# Patient Record
Sex: Male | Born: 1980 | Race: White | Hispanic: No | Marital: Married | Smoking: Current every day smoker
Health system: Southern US, Community
[De-identification: ages and names within clinical notes are randomized; demographics above are authoritative.]

## PROBLEM LIST (undated history)

## (undated) DIAGNOSIS — H5010 Unspecified exotropia: Secondary | ICD-10-CM

## (undated) HISTORY — PX: LUMBAR DISC SURGERY: SHX700

---

## 2007-02-12 ENCOUNTER — Ambulatory Visit (HOSPITAL_COMMUNITY): Admission: RE | Admit: 2007-02-12 | Discharge: 2007-02-12 | Payer: Self-pay | Admitting: Neurosurgery

## 2008-03-29 IMAGING — CR DG LUMBAR SPINE 2-3V
1 series · 1 of 1 positions shown · non-contrast
Comparison: None.

CLINICAL DATA: Localization for laminectomy/discectomy. 
 LUMBAR SPINE ? 2 VIEW:

[view not recorded]
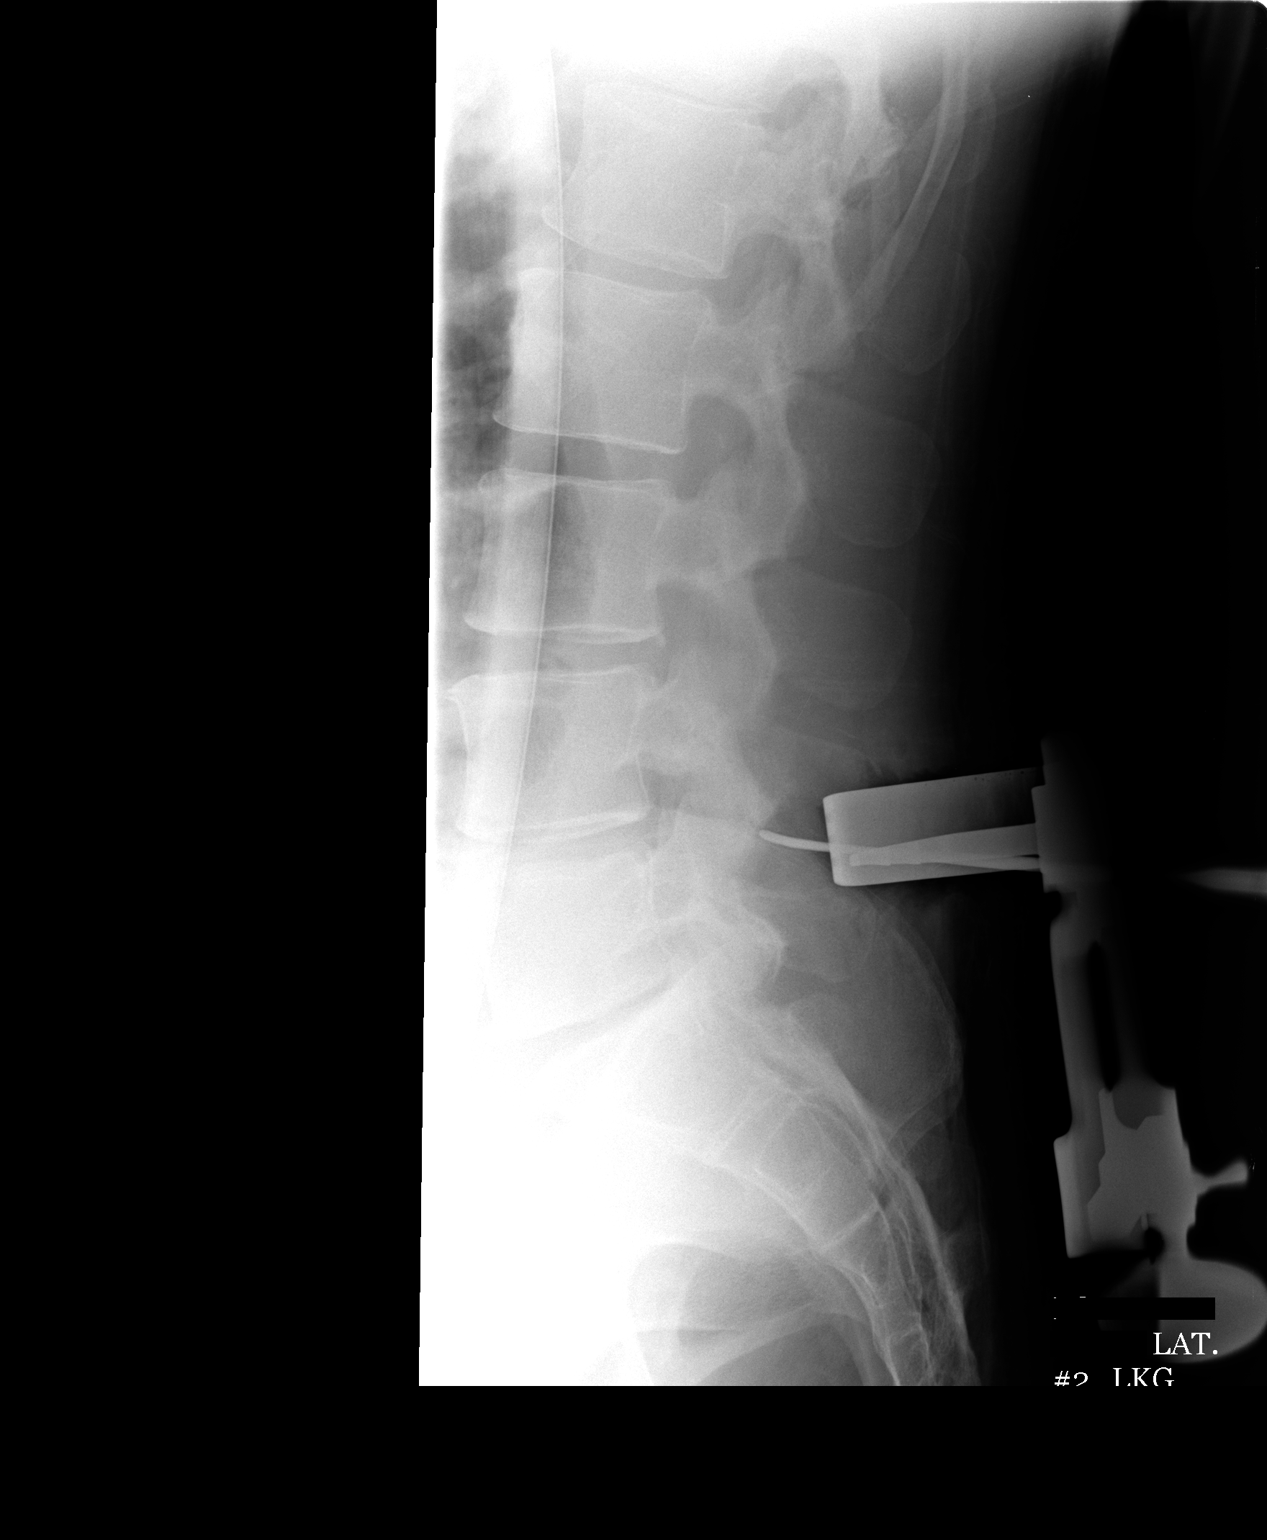

[1 of 1 positions shown; findings below may reference images not displayed]

FINDINGS: Film #1 at 6465 hours ? a needle tip projects over the posterior spinous process of L4.
IMPRESSION: L4 localized. 
 LUMBAR SPINE:
 Film #2 at 7550 hours ? there is an instrument now positioned posterior to the neural canal at L4-5.  Both the L4-5 and the L5-S1 disc space are narrowed.
IMPRESSION: Instruments posterior to L4-5.

## 2012-12-31 ENCOUNTER — Ambulatory Visit: Payer: Self-pay | Admitting: Family Medicine

## 2012-12-31 VITALS — BP 125/76 | HR 93 | Temp 98.7°F | Resp 16 | Ht 74.5 in | Wt 267.0 lb

## 2012-12-31 DIAGNOSIS — Z0289 Encounter for other administrative examinations: Secondary | ICD-10-CM

## 2012-12-31 DIAGNOSIS — Z0000 Encounter for general adult medical examination without abnormal findings: Secondary | ICD-10-CM

## 2012-12-31 NOTE — Patient Instructions (Addendum)
Continue to see your eye doctor regularly

## 2012-12-31 NOTE — Progress Notes (Signed)
[facility] [address] [place] [address] [phone]  Date:  12/31/2012   Name:  Mario Kim   DOB:  [birth year]   MRN:  [id number]  PCP:  No primary provider on file.    Chief Complaint: Employment Physical   History of Present Illness:  Mario Kim is a 32 y.o. very pleasant male patient who presents with the following:  He is here today for a DOT exam. He states he does not have HTN- he does not use any medicaiton for this.  May be in his history in error.   History of diskectomy in 2008- he takes excedrin on occasion, but overall he is doing very well.  He has normal leg strength and sensation.   He has a history of eye dominance/ lazy eye problems.  He sees an eye doctor regularly and has had this problem since childhood.  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    There is no problem list on file for this patient.   Past Medical History  Diagnosis Date  . Asthma   . Hypertension     Past Surgical History  Procedure Date  . Spine surgery     History  Substance Use Topics  . Smoking status: Current Every Day Smoker  . Smokeless tobacco: Not on file  . Alcohol Use: 0.0 oz/week    3-6 Cans of beer per week    Family History  Problem Relation Age of Onset  . Asthma Mother   . Hyperlipidemia Brother     No Known Allergies  Medication list has been reviewed and updated.  No current outpatient prescriptions on file prior to visit.    Review of Systems:  As per HPI- otherwise negative.   Physical Examination: Filed Vitals:   12/31/12 1123  BP: 125/76  Pulse: 93  Temp: 98.7 F (37.1 C)  Resp: 16   Filed Vitals:   12/31/12 1123  Height: 6' 2.5" (1.892 m)  Weight: 267 lb (121.11 kg)   Body mass index is 33.82 kg/(m^2). Ideal Body Weight: Weight in (lb) to have BMI = 25: 196.9   GEN: WDWN, NAD, Non-toxic, A & O x 3 HEENT: Atraumatic, Normocephalic. Neck supple. No masses, No LAD. Bilateral TM  wnl, oropharynx normal.  PEERL,EOMI.  Right eye will deviate externally at times, but is brought back in easily Ears and Nose: No external deformity. CV: RRR, No M/G/R. No JVD. No thrill. No extra heart sounds. PULM: CTA B, no wheezes, crackles, rhonchi. No retractions. No resp. distress. No accessory muscle use. ABD: S, NT, ND,. No rebound. No HSM. EXTR: No c/c/e NEURO Normal gait. Normal strength, sensation and DTR all extremities  PSYCH: Normally interactive. Conversant. Not depressed or anxious appearing.  Calm demeanor.  GU: no inguinal hernia  Able to read the 20/ 40 line with the right eye. See paper DOT form Assessment and Plan: 1. Physical exam, annual    Given a 2 year DOT card today.  He will see his eye doctor later this spring.    Abbe Amsterdam, MD

## 2013-01-03 ENCOUNTER — Encounter: Payer: Self-pay | Admitting: Family Medicine

## 2014-12-29 ENCOUNTER — Ambulatory Visit (INDEPENDENT_AMBULATORY_CARE_PROVIDER_SITE_OTHER): Payer: Self-pay | Admitting: Emergency Medicine

## 2014-12-29 VITALS — BP 110/64 | HR 92 | Temp 98.1°F | Resp 18 | Ht 76.0 in | Wt 294.0 lb

## 2014-12-29 DIAGNOSIS — Z0289 Encounter for other administrative examinations: Secondary | ICD-10-CM

## 2014-12-29 DIAGNOSIS — Z0000 Encounter for general adult medical examination without abnormal findings: Secondary | ICD-10-CM

## 2014-12-29 NOTE — Progress Notes (Signed)
[facility] [address], [place] [address] [phone]  Date:  12/29/2014   Name:  Mario ProwsMichael R Damaso   DOB:  05/01/1981   MRN:  [id number]  PCP:  No primary care provider on file.    Chief Complaint: Annual Exam   History of Present Illness:  Mario Kim is a 34 y.o. very pleasant male patient who presents with the following:  DOT   There are no active problems to display for this patient.   Past Medical History  Diagnosis Date  . Asthma     Past Surgical History  Procedure Laterality Date  . Spine surgery      History  Substance Use Topics  . Smoking status: Current Every Day Smoker  . Smokeless tobacco: Not on file  . Alcohol Use: 0.0 oz/week    3-6 Cans of beer per week    Family History  Problem Relation Age of Onset  . Asthma Mother   . Hyperlipidemia Brother   . Hypertension Brother     No Known Allergies  Medication list has been reviewed and updated.  No current outpatient prescriptions on file prior to visit.   No current facility-administered medications on file prior to visit.    Review of Systems:  As per HPI, otherwise negative.    Physical Examination: Filed Vitals:   12/29/14 1158  BP: 110/64  Pulse: 92  Temp: 98.1 F (36.7 C)  Resp: 18   Filed Vitals:   12/29/14 1158  Height: 6\' 4"  (1.93 m)  Weight: 294 lb (133.358 kg)   Body mass index is 35.8 kg/(m^2). Ideal Body Weight: Weight in (lb) to have BMI = 25: 205  GEN: WDWN, NAD, Non-toxic, A & O x 3 HEENT: Atraumatic, Normocephalic. Neck supple. No masses, No LAD. Ears and Nose: No external deformity. CV: RRR, No M/G/R. No JVD. No thrill. No extra heart sounds. PULM: CTA B, no wheezes, crackles, rhonchi. No retractions. No resp. distress. No accessory muscle use. ABD: S, NT, ND, +BS. No rebound. No HSM. EXTR: No c/c/e NEURO Normal gait.  PSYCH: Normally interactive. Conversant. Not depressed or anxious appearing.  Calm demeanor.     Assessment and Plan: DOT  Signed,  Phillips OdorJeffery Ajaya Crutchfield, MD

## 2015-10-13 ENCOUNTER — Ambulatory Visit: Payer: Self-pay

## 2015-10-13 ENCOUNTER — Other Ambulatory Visit: Payer: Self-pay | Admitting: Occupational Medicine

## 2015-10-13 DIAGNOSIS — M25572 Pain in left ankle and joints of left foot: Secondary | ICD-10-CM

## 2015-11-18 ENCOUNTER — Ambulatory Visit: Payer: Worker's Compensation | Attending: Occupational Medicine | Admitting: Physical Therapy

## 2015-11-18 DIAGNOSIS — R262 Difficulty in walking, not elsewhere classified: Secondary | ICD-10-CM | POA: Diagnosis present

## 2015-11-18 DIAGNOSIS — R600 Localized edema: Secondary | ICD-10-CM | POA: Insufficient documentation

## 2015-11-18 DIAGNOSIS — X58XXXA Exposure to other specified factors, initial encounter: Secondary | ICD-10-CM | POA: Insufficient documentation

## 2015-11-18 DIAGNOSIS — S93402A Sprain of unspecified ligament of left ankle, initial encounter: Secondary | ICD-10-CM | POA: Diagnosis present

## 2015-11-18 NOTE — Therapy (Signed)
[facility] [address] [place], [place], [address] Phone: [phone]   Fax:  [phone]  Physical Therapy Evaluation  Patient Details  Name: Mario Kim MRN: [id number] Date of Birth: 02-Dec-1981 Referring Provider: Alto Denver  Encounter Date: 11/18/2015      PT End of Session - 11/18/15 1838    Visit Number 1   Number of Visits 6   Date for PT Re-Evaluation 12/30/15   PT Start Time 0845   PT Stop Time 0935   PT Time Calculation (min) 50 min   Activity Tolerance Patient tolerated treatment well   Behavior During Therapy [facility] for tasks assessed/performed      Past Medical History  Diagnosis Date  . Asthma     Past Surgical History  Procedure Laterality Date  . Spine surgery      There were no vitals filed for this visit.  Visit Diagnosis:  Left ankle sprain, initial encounter  Walking difficulty due to ankle and foot  Localized edema      Subjective Assessment - 11/18/15 0851    Subjective Pt slipped off truck and injured L ankle.  He wore a Cam walker for a few weeks and transitioned to ASO which he still wears.  He cont to have pain and min swelling.  He feels weaker in L ankle, lacks confidence with work duties. Normal job requires climbing trucks, ladder, Pharmacist, community.  He is on light duty, just driving and moving lighter equipment , office work.      Pertinent History 2008 discectomy.    Limitations Lifting;Standing;Walking;House hold activities;Other (comment)  Stairs   How long can you sit comfortably? sometimes pain incr after 30 min     How long can you stand comfortably? up and moving 20-30 min begins to be sore (5/10).     How long can you walk comfortably? 20-30 min    Diagnostic tests XR   Patient Stated Goals get full function and strength in it.    Currently in Pain? Yes   Pain Score 3    Pain Location Ankle  moves around   Pain Orientation Left   Pain Descriptors / Indicators Sore   Pain Type Chronic pain;Acute pain   Pain Onset More than a month ago   Pain Frequency Intermittent   Aggravating Factors  increased effort with walking or pushing    Pain Relieving Factors ASO brace, Ibuprofen, rest   Effect of Pain on Daily Activities work,             [facility] PT Assessment - 11/18/15 0858    Assessment   Medical Diagnosis L ankle sprain    Referring Provider Hunt   Prior Therapy No   Precautions   Precautions None   Required Braces or Orthoses Other Brace/Splint   Restrictions   Weight Bearing Restrictions No   Balance Screen   Has the patient fallen in the past 6 months Yes   How many times? 2   Has the patient had a decrease in activity level because of a fear of falling?  No   Is the patient reluctant to leave their home because of a fear of falling?  No   Home Nurse, mental health Private residence   Living Arrangements Spouse/significant other;Children   Type of Home House   Home Access Level entry   Entrance Stairs-Number of Steps 20   Entrance Stairs-Rails Can reach both   Home Layout Two level   Prior  Function   Level of Independence Independent   Cognition   Overall Cognitive Status Within Functional Limits for tasks assessed   Observation/Other Assessments-Edema    Edema Circumferential   Circumferential Edema   Circumferential - Right 23.5 inch   Circumferential - Left  24 inch   Sensation   Light Touch Appears Intact   Coordination   Gross Motor Movements are Fluid and Coordinated Not tested   Posture/Postural Control   Posture/Postural Control No significant limitations   AROM   Left Ankle Dorsiflexion -7   Left Ankle Plantar Flexion 47   Left Ankle Inversion 18   Left Ankle Eversion 29   Strength   Right Knee Flexion 5/5   Right Knee Extension 5/5   Left Knee Flexion 4+/5   Left Knee Extension 5/5   Left Ankle Dorsiflexion 4/5   Left Ankle Plantar Flexion 3+/5   Left Ankle Inversion 4/5   Left Ankle Eversion  3+/5   Palpation   Palpation comment min soreness, puffy lateral malleolus and into the top of foot            PT Education - 11/18/15 1838    Education provided Yes   Education Details PT/POC, ice post HEP and ankle HEP for Theraband, calf stretch   Person(s) Educated Patient   Methods Explanation;Demonstration;Verbal cues;Handout;Tactile cues   Comprehension Verbalized understanding;Returned demonstration             PT Long Term Goals - 11/18/15 1854    PT LONG TERM GOAL #1   Title Pt will be able to wean off ASO when not at work as MD allows.    Time 4   Period Weeks   Status New   PT LONG TERM GOAL #2   Title Pt will be able to stand and walk for up to an hour with only min increase in pain   Time 4   Period Weeks   Status New   PT LONG TERM GOAL #3   Title Pt will be able to demo strength in L ankle to 4+/5 or more in all planes to improve gait stability, work duties.    Time 4   Period Weeks   Status New   PT LONG TERM GOAL #4   Title Pt will demo full AROM in L ankle without increased pain.    Time 4   Period Weeks   Status New               Plan - 11/18/15 1839    Clinical Impression Statement Patient with pain, swelling and decreased ROM following L ankle sprain and subsequent immobility of ankle joint.   He was approved 6 visits and hopes to regain full function to allow return to full work load.    Pt will benefit from skilled therapeutic intervention in order to improve on the following deficits Decreased range of motion;Difficulty walking;Increased fascial restricitons;Pain;Impaired flexibility;Decreased balance;Decreased mobility;Decreased strength;Increased edema   Rehab Potential Excellent   PT Frequency 2x / week   PT Duration 3 weeks   PT Treatment/Interventions Ultrasound;Passive range of motion;Patient/family education;Gait training;Balance training;Therapeutic exercise;Manual techniques;Vasopneumatic Device;Therapeutic  activities;Electrical Stimulation;Functional mobility training;Taping;Stair training;Cryotherapy;Neuromuscular re-education   PT Next Visit Plan review HEP, US, assess single leg balance, foot instrinsics, vaso, also check Rt. ankle AROM    PT Home Exercise Plan T band green x 4 way and calf stretch   Consulted and Agree with Plan of Care Patient  Problem List There are no active problems to display for this patient.   Janila Arrazola 11/18/2015, 7:03 PM  [facility] [address] [place], [place], [address] Phone: [phone]   Fax:  [phone]  Name: Mario Kim MRN: [id number] Date of Birth: Dec 21, 1980  Karie Mainland, PT 11/18/2015 7:04 PM Phone: [phone] Fax: [phone]

## 2015-11-18 NOTE — Patient Instructions (Signed)
Gastroc / Heel Cord Stretch - Seated With Towel   Sit on floor, towel around ball of foot. Gently pull foot in toward body, stretching heel cord and calf. Hold for __30_ seconds. Repeat on involved leg. Repeat __5_ times. Do __2_ times per day. Ankle Circles    Slowly rotate right foot and ankle clockwise then counterclockwise. Gradually increase range of motion. Avoid pain. Circle __10-20__ times each direction per set. Do __2__ sets per session. Do ___2_ sessions per day.  http://orth.exer.us/30   Copyright  VHI. All rights reserved.     Inversion: Resisted   Cross legs with right leg underneath, foot in tubing loop. Hold tubing around other foot to resist and turn foot in. Repeat _10-20___ times per set. Do ___1-2_ sets per session. Do __2__ sessions per day.  http://orth.exer.us/12   Copyright  VHI. All rights reserved.  Eversion: Resisted   With right foot in tubing loop, hold tubing around other foot to resist and turn foot out. Repeat __10-20__ times per set. Do __1-2__ sets per session. Do __2__ sessions per day.  http://orth.exer.us/14   Copyright  VHI. All rights reserved.  Plantar Flexion: Resisted   Anchor behind, tubing around left foot, press down. Repeat __10-20__ times per set. Do ___1-2_ sets per session. Do _2___ sessions per day.  http://orth.exer.us/10   Copyright  VHI. All rights reserved.  Dorsiflexion: Resisted   Facing anchor, tubing around left foot, pull toward face.  Repeat _10-20___ times per set. Do _1___ sets per session. Do __2__ sessions per day.  http://orth.exer.us/8   Copyright  VHI. All rights reserved.

## 2015-11-19 ENCOUNTER — Ambulatory Visit: Payer: Worker's Compensation | Admitting: Physical Therapy

## 2015-11-19 DIAGNOSIS — R600 Localized edema: Secondary | ICD-10-CM

## 2015-11-19 DIAGNOSIS — S93402A Sprain of unspecified ligament of left ankle, initial encounter: Secondary | ICD-10-CM

## 2015-11-19 DIAGNOSIS — R262 Difficulty in walking, not elsewhere classified: Secondary | ICD-10-CM

## 2015-11-19 NOTE — Therapy (Signed)
[facility] [address] [place], [place], [address] Phone: [phone]   Fax:  [phone]  Physical Therapy Treatment  Patient Details  Name: Mario Kim MRN: [id number] Date of Birth: [birth year] Referring Provider: Alto Denver  Encounter Date: 11/19/2015      PT End of Session - 11/19/15 0956    Visit Number 2   Number of Visits 6   Date for PT Re-Evaluation 12/30/15   PT Start Time 0845   PT Stop Time 0938   PT Time Calculation (min) 53 min      Past Medical History  Diagnosis Date  . Asthma     Past Surgical History  Procedure Laterality Date  . Spine surgery      There were no vitals filed for this visit.  Visit Diagnosis:  Left ankle sprain, initial encounter  Walking difficulty due to ankle and foot  Localized edema    Subjective : No pain         [facility] Adult PT Treatment/Exercise - 11/19/15 0849    Modalities   Modalities Ultrasound   Ultrasound   Ultrasound Location left anteriolateral ankle    Ultrasound Parameters 1.2 w/cm2 pulsed   Ultrasound Goals Edema;Pain   Ankle Exercises: Standing   SLS 15 sec left, 50 sec Right  3 trials on left.    Rocker Board 1 minute   Heel Raises 10 reps   Toe Raise 5 reps   Toe Raise Limitations increased pain lateral anterior foot left    Ankle Exercises: Stretches   Gastroc Stretch 3 reps;30 seconds   Gastroc Stretch Limitations edge of 2 inch step   Ankle Exercises: Supine   T-Band green band x 15 each                   PT Long Term Goals - 11/18/15 1854    PT LONG TERM GOAL #1   Title Pt will be able to wean off ASO when not at work as MD allows.    Time 4   Period Weeks   Status New   PT LONG TERM GOAL #2   Title Pt will be able to stand and walk for up to an hour with only min increase in pain   Time 4   Period Weeks   Status New   PT LONG TERM GOAL #3   Title Pt will be able to demo strength in L ankle to 4+/5 or more in all  planes to improve gait stability, work duties.    Time 4   Period Weeks   Status New   PT LONG TERM GOAL #4   Title Pt will demo full AROM in L ankle without increased pain.    Time 4   Period Weeks   Status New               Plan - 11/19/15 0950    Clinical Impression Statement Single leg stance decreased L vs R. Increased pain after several reps of standing heel/ toe raises. Review of Green band ankle and optional standing DF stretch due to hamstring and LBP with seated stretch. [place] to lateral and anterior ankle as well as vaso to control edema.    PT Next Visit Plan review SLS , assess foot instrinsics, also check Rt. ankle AROM         Problem List There are no active problems to display for this patient.   Sherrie Mustache, PTA 11/19/2015, 10:05  AM  [facility] [address] [place], [place], [address] Phone: [phone]   Fax:  [phone]  Name: Mario Kim MRN: [id number] Date of Birth: 10/09/1981

## 2015-11-23 ENCOUNTER — Ambulatory Visit: Payer: Worker's Compensation | Attending: Occupational Medicine | Admitting: Physical Therapy

## 2015-11-23 DIAGNOSIS — R600 Localized edema: Secondary | ICD-10-CM

## 2015-11-23 DIAGNOSIS — X58XXXA Exposure to other specified factors, initial encounter: Secondary | ICD-10-CM | POA: Diagnosis not present

## 2015-11-23 DIAGNOSIS — R262 Difficulty in walking, not elsewhere classified: Secondary | ICD-10-CM

## 2015-11-23 DIAGNOSIS — S93402A Sprain of unspecified ligament of left ankle, initial encounter: Secondary | ICD-10-CM

## 2015-11-23 NOTE — Therapy (Signed)
[place], [place], [address] Phone: [phone]   Fax:  [phone]  Physical Therapy Treatment  Patient Details  Name: Mario Kim MRN: [id number] Date of Birth: 1981/08/11 Referring Provider: Geoffry Paradise  Encounter Date: 11/23/2015      PT End of Session - 11/23/15 1709    Visit Number 3   Number of Visits 6   Date for PT Re-Evaluation 12/30/15   PT Start Time 1504   PT Stop Time 1600   PT Time Calculation (min) 56 min   Activity Tolerance Patient tolerated treatment well   Behavior During Therapy [facility] for tasks assessed/performed      Past Medical History  Diagnosis Date  . Asthma     Past Surgical History  Procedure Laterality Date  . Spine surgery      There were no vitals filed for this visit.  Visit Diagnosis:  Left ankle sprain, initial encounter  Walking difficulty due to ankle and foot  Localized edema      Subjective Assessment - 11/23/15 1502    Subjective No pain.    Had stiffness this morning.  Wears ASO all day.  Still on desk duty.  Needs to jump and climb on a truck.     Currently in Pain? 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   Ankle Exercises: Seated   ABC's Limitations 1 st name capitals.   Towel Crunch --  20 reps   Towel Inversion/Eversion Weights (lbs) 2, 0   Toe Raise 20 reps   Ankle Exercises: Aerobic   Stationary Bike L6 , 5 minutes   Ankle Exercises: Standing   Rocker Board --  10 X DF, PF   Other Standing Ankle Exercises Stand LT slide RT leg 3 ways on towel 10 X each direction.     Other Standing Ankle Exercises Step up forward, lateral, 6 inches 10 X  each.                       PT Long Term Goals - 11/23/15 1712    PT LONG TERM GOAL #1   Title Pt will be able to wean off ASO when not at work as MD allows.    Baseline wears per MD   Time 4   Period Weeks   Status On-going   PT LONG TERM GOAL #2   Title Pt will be able to stand and walk for up to an hour with only min increase in pain   Baseline varies   Time 4   Period Weeks   Status On-going   PT LONG TERM GOAL #3   Title Pt will be able to demo strength in L ankle to 4+/5 or more in all planes to improve gait stability, work duties.    Time 4   Period Weeks   Status On-going   PT LONG TERM GOAL #4   Title Pt will demo full AROM in L ankle without increased pain.    Time 4   Period Weeks  Status On-going               Plan - 11/23/15 1710    Clinical Impression Statement This was a good day for patient to exercise seated and with closed chain.  No new goals met.  Progress toard ROM goal DF tho still limited   PT Next Visit Plan more closed chain.    Measure edema?, ROM   PT Home Exercise Plan continue   Consulted and Agree with Plan of Care Patient        Problem List There are no active problems to display for this patient.   [facility] 11/23/2015, 5:14 PM  [facility] [address] [place], [place], [address] Phone: [phone]   Fax:  [phone]  Name: Mario Kim MRN: [id number] Date of Birth: 11/16/1981    Melvenia Needles, PTA 11/23/2015 5:14 PM Phone: [phone] Fax: [phone]

## 2015-11-25 ENCOUNTER — Ambulatory Visit: Payer: Worker's Compensation | Admitting: Physical Therapy

## 2015-11-25 DIAGNOSIS — R262 Difficulty in walking, not elsewhere classified: Secondary | ICD-10-CM

## 2015-11-25 DIAGNOSIS — S93402A Sprain of unspecified ligament of left ankle, initial encounter: Secondary | ICD-10-CM | POA: Diagnosis not present

## 2015-11-25 DIAGNOSIS — R600 Localized edema: Secondary | ICD-10-CM

## 2015-11-25 NOTE — Therapy (Signed)
[facility] [address] [place], [place], [address] Phone: [phone]   Fax:  [phone]  Physical Therapy Treatment  Patient Details  Name: Mario Kim MRN: [id number] Date of Birth: 07/01/1981 Referring Provider: Alto DenverHunt  Encounter Date: 11/25/2015      PT End of Session - 11/25/15 1500    Visit Number 4   Number of Visits 6   Date for PT Re-Evaluation 12/30/15   PT Start Time 1416   PT Stop Time 1508   PT Time Calculation (min) 52 min   Activity Tolerance Patient tolerated treatment well   Behavior [facility] for tasks assessed/performed      Past Medical History  Diagnosis Date  . Asthma     Past Surgical History  Procedure Laterality Date  . Spine surgery      There were no vitals filed for this visit.  Visit Diagnosis:  Left ankle sprain, initial encounter  Walking difficulty due to ankle and foot  Localized edema      Subjective Assessment - 11/25/15 1418    Subjective First thing this morning 1 or 2/10 .  Now OK.     Currently in Pain? No/denies                         [facility]Ex[facility] - 11/25/15 1419    Cryotherapy   Number Minutes Cryotherapy 10 Minutes   Cryotherapy Location --  foot/ankle   Type of Cryotherapy --  cold pack   Ankle Exercises: Standing   BAPS Sitting;Level 2;10 reps;Weight   SLS ball toss catch at trampoline with ASO, Single leg, SBA   Heel Raises 10 reps  2 sewts   Toe Raise --  10 X 2 sets   Other Standing Ankle Exercises Stand Lt towel slides 3 way 10 X   Other Standing Ankle Exercises step up 6 inches 10 X, Cable pull 4 way 10 X each, SBA,    Ankle Exercises: Aerobic   Stationary Bike 5 minutes.   Ankle Exercises: Stretches   Gastroc Stretch 3 reps;30 seconds                     PT Long Term Goals - 11/23/15 1712    PT LONG TERM GOAL #1   Title Pt will be able to wean off ASO when not at work as MD  allows.    Baseline wears per MD   Time 4   Period Weeks   Status On-going   PT LONG TERM GOAL #2   Title Pt will be able to stand and walk for up to an hour with only min increase in pain   Baseline varies   Time 4   Period Weeks   Status On-going   PT LONG TERM GOAL #3   Title Pt will be able to demo strength in L ankle to 4+/5 or more in all planes to improve gait stability, work duties.    Time 4   Period Weeks   Status On-going   PT LONG TERM GOAL #4   Title Pt will demo full AROM in L ankle without increased pain.    Time 4   Period Weeks   Status On-going               Plan - 11/25/15 1502    Clinical Impression Statement Just a little sore post session.  Dull ach.  Able  to increase weightbearing exercises.   PT Next Visit Plan closed chain,  strength/ stretch   PT Home Exercise Plan continue   Consulted and Agree with Plan of Care Patient        Problem List There are no active problems to display for this patient.   [facility] 11/25/2015, 3:04 PM  [facility] [address] [place], [place], [address] Phone: [phone]   Fax:  [phone]  Name: Mario Kim MRN: [id number] Date of Birth: 1981-11-26    Liz Beach, PTA 11/25/2015 3:04 PM Phone: [phone] Fax: [phone]

## 2015-12-02 ENCOUNTER — Ambulatory Visit: Payer: Worker's Compensation | Admitting: Physical Therapy

## 2015-12-02 DIAGNOSIS — R262 Difficulty in walking, not elsewhere classified: Secondary | ICD-10-CM

## 2015-12-02 DIAGNOSIS — S93402A Sprain of unspecified ligament of left ankle, initial encounter: Secondary | ICD-10-CM | POA: Diagnosis not present

## 2015-12-02 DIAGNOSIS — R600 Localized edema: Secondary | ICD-10-CM

## 2015-12-02 NOTE — Therapy (Signed)
[place], [place], [address] Phone: [phone]   Fax:  [phone]  Physical Therapy Treatment  Patient Details  Name: Mario Kim MRN: [id number] Date of Birth: Dec 09, 1980 Referring Provider: Geoffry Paradise  Encounter Date: 12/02/2015      PT End of Session - 12/02/15 1513    Visit Number 5   Number of Visits 6   Date for PT Re-Evaluation 12/30/15   PT Start Time 9892   PT Stop Time 1500   PT Time Calculation (min) 40 min   Activity Tolerance Patient tolerated treatment well   Behavior During Therapy [name] for tasks assessed/performed      Past Medical History  Diagnosis Date  . Asthma     Past Surgical History  Procedure Laterality Date  . Spine surgery      There were no vitals filed for this visit.  Visit Diagnosis:  Left ankle sprain, initial encounter  Walking difficulty due to ankle and foot  Localized edema      Subjective Assessment - 12/02/15 1424    Subjective No pain right now.  Feels weak in eversion. Was doing some modified climbing on a low trailer and did fine.  Pain never >2/10   Currently in Pain? No/denies            [facility] -  PT Assessment - 12/02/15 1434    Circumferential Edema   Circumferential - Left  24.5 inch    AROM   Left Ankle Dorsiflexion 9   Left Ankle Plantar Flexion 49   Left Ankle Inversion 36   Left Ankle Eversion 30   Strength   Left Ankle Dorsiflexion 4+/5   Left Ankle Plantar Flexion 4/5   Left Ankle Inversion 4+/5   Left Ankle Eversion 4/5             [facility] Adult PT Treatment/Exercise - 12/02/15 1440    Ankle Exercises: Stretches   Soleus Stretch 3 reps;30 seconds   Gastroc Stretch 3 reps;30 seconds   Ankle Exercises: Standing   SLS single leg balance on foam semicircle    Balance Master: Limits for Stability various foam board ex to challenge LE stability   Heel Raises 10 reps  off step, 3 sets ER, IR and parallel   Toe Raise  Limitations wall sit with toe raises and heel lifts x10    Other Standing Ankle Exercises step ups 8 inch x 10, SLS hip abd x 10 (on LLE)     Used wall and step for stretching.   Foam board: Theraband horiz pull in parallel and in squat position.  Eyes closed, head turns  BOSU static lunge hold alternating x 5, 15 sec        PT Education - 12/02/15 1513    Education provided Yes   Education Details progress, HEP, stability   Person(s) Educated Patient   Methods Explanation   Comprehension Verbalized understanding;Returned demonstration             PT Long Term Goals - 12/02/15 1514    PT LONG TERM GOAL #1   Title Pt will be able to wean off ASO when not at work as MD allows.    Status On-going   PT LONG TERM GOAL #2   Title Pt will be able to stand and walk for up to an hour with only min increase in pain   Status Achieved   PT LONG TERM GOAL #3   Title Pt will be  able to demo strength in L ankle to 4+/5 or more in all planes to improve gait stability, work duties.    Status Partially Met   PT LONG TERM GOAL #4   Title Pt will demo full AROM in L ankle without increased pain.    Status Achieved               Plan - 12/02/15 1514    Clinical Impression Statement Goals 2, 4 met.  Pt with cont weakness into EV>INV and PF. Able to squat, lunge without pain.  Full AROM with much improved DF motion. Friday is last visit, may hold until MD clears vs ask for more visits.    PT Next Visit Plan closed chain,  strength/ stretch   PT Home Exercise Plan continue   Consulted and Agree with Plan of Care Patient        Problem List There are no active problems to display for this patient.   Zuleima Haser 12/02/2015, 3:17 PM  [facility] [address] [place], [place], [address] Phone: [phone]   Fax:  [phone]  Name: Mario Kim MRN: [id number] Date of Birth: [birth year]  Raeford Razor, PT 12/02/2015  3:20 PM Phone: [phone] Fax: [phone]

## 2015-12-04 ENCOUNTER — Ambulatory Visit: Payer: Worker's Compensation | Admitting: Physical Therapy

## 2015-12-04 DIAGNOSIS — S93402A Sprain of unspecified ligament of left ankle, initial encounter: Secondary | ICD-10-CM | POA: Diagnosis not present

## 2015-12-04 DIAGNOSIS — R262 Difficulty in walking, not elsewhere classified: Secondary | ICD-10-CM

## 2015-12-04 DIAGNOSIS — R600 Localized edema: Secondary | ICD-10-CM

## 2015-12-04 NOTE — Therapy (Addendum)
[place], [place], [address] Phone: [phone]   Fax:  [phone]  Physical Therapy Treatment/Discharge  Patient Details  Name: Mario Kim MRN: [id number] Date of Birth: [birth year] Referring Provider: Geoffry Paradise  Encounter Date: 12/04/2015      PT End of Session - 12/04/15 0814    Visit Number 6   Number of Visits 6   Date for PT Re-Evaluation 12/30/15   PT Start Time 0800   PT Stop Time 0833   PT Time Calculation (min) 33 min      Past Medical History  Diagnosis Date  . Asthma     Past Surgical History  Procedure Laterality Date  . Spine surgery      There were no vitals filed for this visit.  Visit Diagnosis:  Left ankle sprain, initial encounter  Walking difficulty due to ankle and foot  Localized edema      Subjective Assessment - 12/04/15 0814    Currently in Pain? No/denies            [facility] PT Assessment - 12/04/15 0001    AROM   Left Ankle Dorsiflexion 9   Left Ankle Plantar Flexion 49   Left Ankle Inversion 36   Left Ankle Eversion 30   Strength   Left Ankle Dorsiflexion 4+/5   Left Ankle Plantar Flexion 5/5  25 heel raises in SLS   Left Ankle Inversion 4+/5   Left Ankle Eversion 4/5                     [facility] Adult PT Treatment/Exercise - 12/04/15 0001    Ankle Exercises: Standing   SLS single leg balance on foam semicircle    Balance Master: Limits for Stability various foam board ex to challenge LE stability   Heel Raises Other (comment)  25 single   Ankle Exercises: Aerobic   Elliptical L1 Ramp 1 x 5 minutes   Ankle Exercises: Supine   T-Band green band x 15 each   Ankle Exercises: Stretches   Gastroc Stretch 3 reps;30 seconds                     PT Long Term Goals - 12/04/15 7616    PT LONG TERM GOAL #1   Title Pt will be able to wean off ASO when not at work as MD allows.    Time 4   Period Weeks   Status Partially  Met   PT LONG TERM GOAL #2   Title Pt will be able to stand and walk for up to an hour with only min increase in pain   Time 4   Period Weeks   Status Achieved   PT LONG TERM GOAL #3   Title Pt will be able to demo strength in L ankle to 4+/5 or more in all planes to improve gait stability, work duties.    Time 4   Period Weeks   Status Partially Met   PT LONG TERM GOAL #4   Title Pt will demo full AROM in L ankle without increased pain.    Time 4   Period Weeks   Status Achieved               Plan - 12/04/15 0737    Clinical Impression Statement Pt reports no brace unless working. LTG#1 partially met. Will see MD jan 9th for oficial release from brace. Challenged balance with closed chain/ dynamic  SLS on unlevel surfaces. Review of HEP, pt is independent. Able to perform 25 single leg heel raises on left. 5/5 PF strength.    PT Next Visit Plan HOLD PT for now. Pt to call after MD appt         Problem List There are no active problems to display for this patient.   Dorene Ar, [place] 12/04/2015, 8:45 AM  [facility] [address] [place], [place], [address] Phone: [phone]   Fax:  [phone]  Name: LORENSO Kim MRN: [id number] Date of Birth: Jan 10, 1981    PHYSICAL THERAPY DISCHARGE SUMMARY  Visits from Start of Care: 6  Current functional level related to goals / functional outcomes: Unknown at this time    Remaining deficits: Unknown, see goals met    Education / Equipment: HEP, RICE  Plan: Patient agrees to discharge.  Patient goals were partially met. Patient is being discharged due to not returning since the last visit.  ?????     Raeford Razor, PT 11/09/16 1:50 PM Phone: [phone] Fax: [phone]

## 2017-04-21 ENCOUNTER — Encounter (HOSPITAL_BASED_OUTPATIENT_CLINIC_OR_DEPARTMENT_OTHER): Payer: Self-pay | Admitting: *Deleted

## 2017-04-24 ENCOUNTER — Ambulatory Visit: Payer: Self-pay | Admitting: Ophthalmology

## 2017-04-24 NOTE — H&P (Signed)
Date of examination:  04-17-17  Indication for surgery: to straighten the eyes and preserve binocularity  Pertinent past medical history:  Past Medical History:  Diagnosis Date  . Asthma     Pertinent ocular history:  X(T) "as long as I can remember."  Planning LASIK: LASIK surgery wants strabismus repaired before LASIK is done  Pertinent family history:  Family History  Problem Relation Age of Onset  . Asthma Mother   . Hyperlipidemia Brother   . Hypertension Brother     General:  Healthy appearing patient in no distress.    Eyes:    Acuity  cc OD 20/30  OS 20/20  External: Within normal limits     Anterior segment: Within normal limits     Motility:   XT=55, XT'=60, Rots nl  Fundus: deferred  Refraction: 5 D cyl OD  Heart: Regular rate and rhythm without murmur     Lungs: Clear to auscultation       Impression:Exotropia   Mild amblyopia right eye   Marked astigmatism right eye  Plan: Lateral rectus muscle recession both eyes  Patient plans LASIK after strabismus surgery  Shara BlazingYOUNG,Asencion Guisinger O

## 2017-04-28 ENCOUNTER — Ambulatory Visit (HOSPITAL_BASED_OUTPATIENT_CLINIC_OR_DEPARTMENT_OTHER)
Admission: RE | Admit: 2017-04-28 | Discharge: 2017-04-28 | Disposition: A | Discharge status: Home / Self Care | Payer: 59 | Source: Ambulatory Visit | Attending: Ophthalmology | Admitting: Ophthalmology

## 2017-04-28 ENCOUNTER — Encounter (HOSPITAL_BASED_OUTPATIENT_CLINIC_OR_DEPARTMENT_OTHER): Payer: Self-pay | Admitting: *Deleted

## 2017-04-28 ENCOUNTER — Ambulatory Visit (HOSPITAL_BASED_OUTPATIENT_CLINIC_OR_DEPARTMENT_OTHER): Payer: 59 | Admitting: Anesthesiology

## 2017-04-28 ENCOUNTER — Encounter (HOSPITAL_BASED_OUTPATIENT_CLINIC_OR_DEPARTMENT_OTHER)
Admission: RE | Disposition: A | Discharge status: Home / Self Care | Payer: Self-pay | Source: Ambulatory Visit | Attending: Ophthalmology

## 2017-04-28 DIAGNOSIS — J45909 Unspecified asthma, uncomplicated: Secondary | ICD-10-CM | POA: Insufficient documentation

## 2017-04-28 DIAGNOSIS — F17200 Nicotine dependence, unspecified, uncomplicated: Secondary | ICD-10-CM | POA: Insufficient documentation

## 2017-04-28 DIAGNOSIS — H5010 Unspecified exotropia: Secondary | ICD-10-CM | POA: Insufficient documentation

## 2017-04-28 DIAGNOSIS — Z8249 Family history of ischemic heart disease and other diseases of the circulatory system: Secondary | ICD-10-CM | POA: Diagnosis not present

## 2017-04-28 HISTORY — PX: STRABISMUS SURGERY: SHX218

## 2017-04-28 SURGERY — STRABISMUS SURGERY, BILATERAL
Anesthesia: General | Site: Eye | Laterality: Bilateral

## 2017-04-28 MED ORDER — LIDOCAINE 2% (20 MG/ML) 5 ML SYRINGE
INTRAMUSCULAR | Status: DC | PRN
  Administered 2017-04-28: 40 mg via INTRAVENOUS

## 2017-04-28 MED ORDER — KETOROLAC TROMETHAMINE 30 MG/ML IJ SOLN
INTRAMUSCULAR | Status: DC | PRN
Start: 2017-04-28 — End: 2017-04-28
  Administered 2017-04-28: 30 mg via INTRAVENOUS

## 2017-04-28 MED ORDER — MIDAZOLAM HCL 2 MG/2ML IJ SOLN
INTRAMUSCULAR | Status: AC
  Filled 2017-04-28: qty 2

## 2017-04-28 MED ORDER — DEXAMETHASONE SODIUM PHOSPHATE 10 MG/ML IJ SOLN
INTRAMUSCULAR | Status: AC
  Filled 2017-04-28: qty 1

## 2017-04-28 MED ORDER — LIDOCAINE 2% (20 MG/ML) 5 ML SYRINGE
INTRAMUSCULAR | Status: AC
  Filled 2017-04-28: qty 5

## 2017-04-28 MED ORDER — LACTATED RINGERS IV SOLN
INTRAVENOUS | Status: DC
  Administered 2017-04-28 (×2): via INTRAVENOUS

## 2017-04-28 MED ORDER — MIDAZOLAM HCL 2 MG/2ML IJ SOLN
1.0000 mg | INTRAMUSCULAR | Status: DC | PRN
  Administered 2017-04-28: 2 mg via INTRAVENOUS

## 2017-04-28 MED ORDER — FENTANYL CITRATE (PF) 100 MCG/2ML IJ SOLN
50.0000 ug | INTRAMUSCULAR | Status: AC | PRN
  Administered 2017-04-28: 100 ug via INTRAVENOUS
  Administered 2017-04-28 (×4): 50 ug via INTRAVENOUS

## 2017-04-28 MED ORDER — ONDANSETRON HCL 4 MG/2ML IJ SOLN
INTRAMUSCULAR | Status: AC
  Filled 2017-04-28: qty 2

## 2017-04-28 MED ORDER — FENTANYL CITRATE (PF) 100 MCG/2ML IJ SOLN: 25.0000 ug | INTRAMUSCULAR | Status: DC | PRN

## 2017-04-28 MED ORDER — GLYCOPYRROLATE 0.2 MG/ML IJ SOLN
INTRAMUSCULAR | Status: DC | PRN
  Administered 2017-04-28: .4 mg via INTRAVENOUS

## 2017-04-28 MED ORDER — FENTANYL CITRATE (PF) 100 MCG/2ML IJ SOLN
INTRAMUSCULAR | Status: AC
  Filled 2017-04-28: qty 2

## 2017-04-28 MED ORDER — SCOPOLAMINE 1 MG/3DAYS TD PT72: 1.0000 | MEDICATED_PATCH | Freq: Once | TRANSDERMAL | Status: DC | PRN

## 2017-04-28 MED ORDER — ONDANSETRON HCL 4 MG/2ML IJ SOLN: 4.0000 mg | Freq: Once | INTRAMUSCULAR | Status: DC | PRN

## 2017-04-28 MED ORDER — DEXAMETHASONE SODIUM PHOSPHATE 4 MG/ML IJ SOLN
INTRAMUSCULAR | Status: DC | PRN
  Administered 2017-04-28: 10 mg via INTRAVENOUS

## 2017-04-28 MED ORDER — PROPOFOL 10 MG/ML IV BOLUS
INTRAVENOUS | Status: AC
  Filled 2017-04-28: qty 20

## 2017-04-28 MED ORDER — TOBRAMYCIN-DEXAMETHASONE 0.3-0.1 % OP OINT: 1.0000 "application " | TOPICAL_OINTMENT | Freq: Two times a day (BID) | OPHTHALMIC | 0 refills | Status: DC

## 2017-04-28 MED ORDER — ONDANSETRON HCL 4 MG/2ML IJ SOLN
INTRAMUSCULAR | Status: DC | PRN
  Administered 2017-04-28: 4 mg via INTRAVENOUS

## 2017-04-28 MED ORDER — PROPOFOL 10 MG/ML IV BOLUS
INTRAVENOUS | Status: DC | PRN
  Administered 2017-04-28: 250 mg via INTRAVENOUS

## 2017-04-28 MED ORDER — TOBRAMYCIN-DEXAMETHASONE 0.3-0.1 % OP OINT
TOPICAL_OINTMENT | OPHTHALMIC | Status: DC | PRN
  Administered 2017-04-28: 1 via OPHTHALMIC

## 2017-04-28 SURGICAL SUPPLY — 31 items
APPLICATOR COTTON TIP 6IN STRL (MISCELLANEOUS) ×8 IMPLANT
APPLICATOR DR MATTHEWS STRL (MISCELLANEOUS) ×2 IMPLANT
BANDAGE EYE OVAL (MISCELLANEOUS) IMPLANT
CAUTERY EYE LOW TEMP 1300F FIN (OPHTHALMIC RELATED) IMPLANT
COVER BACK TABLE 60X90IN (DRAPES) ×2 IMPLANT
COVER MAYO STAND STRL (DRAPES) ×2 IMPLANT
DRAPE SURG 17X23 STRL (DRAPES) ×4 IMPLANT
DRAPE U-SHAPE 76X120 STRL (DRAPES) ×2 IMPLANT
GLOVE BIO SURGEON STRL SZ 6 (GLOVE) ×2 IMPLANT
GLOVE BIO SURGEON STRL SZ 6.5 (GLOVE) ×4 IMPLANT
GLOVE BIOGEL M STRL SZ7.5 (GLOVE) ×2 IMPLANT
GOWN STRL REUS W/ TWL LRG LVL3 (GOWN DISPOSABLE) ×2 IMPLANT
GOWN STRL REUS W/TWL LRG LVL3 (GOWN DISPOSABLE) ×2
GOWN STRL REUS W/TWL XL LVL3 (GOWN DISPOSABLE) ×2 IMPLANT
NS IRRIG 1000ML POUR BTL (IV SOLUTION) ×2 IMPLANT
PACK BASIN DAY SURGERY FS (CUSTOM PROCEDURE TRAY) ×2 IMPLANT
SHEET MEDIUM DRAPE 40X70 STRL (DRAPES) IMPLANT
SLEEVE SCD COMPRESS KNEE MED (MISCELLANEOUS) ×2 IMPLANT
SPEAR EYE SURG WECK-CEL (MISCELLANEOUS) ×4 IMPLANT
STRIP CLOSURE SKIN 1/4X4 (GAUZE/BANDAGES/DRESSINGS) IMPLANT
SUT 6 0 SILK T G140 8DA (SUTURE) IMPLANT
SUT MERSILENE 6-0 18IN S14 8MM (SUTURE)
SUT PLAIN 6 0 TG1408 (SUTURE) ×2 IMPLANT
SUT SILK 4 0 C 3 735G (SUTURE) IMPLANT
SUT VICRYL 6 0 S 28 (SUTURE) IMPLANT
SUT VICRYL ABS 6-0 S29 18IN (SUTURE) ×4 IMPLANT
SUTURE MERSLN 6-0 18IN S14 8MM (SUTURE) IMPLANT
SYR 10ML LL (SYRINGE) ×2 IMPLANT
SYR TB 1ML LL NO SAFETY (SYRINGE) ×2 IMPLANT
TOWEL OR 17X24 6PK STRL BLUE (TOWEL DISPOSABLE) ×2 IMPLANT
TRAY DSU PREP LF (CUSTOM PROCEDURE TRAY) ×2 IMPLANT

## 2017-04-28 NOTE — Anesthesia Procedure Notes (Signed)
Procedure Name: LMA Insertion Date/Time: 04/28/2017 9:59 AM Performed by: Burna CashONRAD, Freeland Pracht C Pre-anesthesia Checklist: Patient identified, Emergency Drugs available, Suction available and Patient being monitored Patient Re-evaluated:Patient Re-evaluated prior to inductionOxygen Delivery Method: Circle system utilized Preoxygenation: Pre-oxygenation with 100% oxygen Intubation Type: IV induction Ventilation: Mask ventilation without difficulty LMA: LMA flexible inserted LMA Size: 5.0 Number of attempts: 1 Airway Equipment and Method: Bite block Placement Confirmation: positive ETCO2 Tube secured with: Tape Dental Injury: Teeth and Oropharynx as per pre-operative assessment

## 2017-04-28 NOTE — H&P (View-Only) (Signed)
Date of examination:  04-17-17  Indication for surgery: to straighten the eyes and preserve binocularity  Pertinent past medical history:  Past Medical History:  Diagnosis Date  . Asthma     Pertinent ocular history:  X(T) "as long as I can remember."  Planning LASIK: LASIK surgery wants strabismus repaired before LASIK is done  Pertinent family history:  Family History  Problem Relation Age of Onset  . Asthma Mother   . Hyperlipidemia Brother   . Hypertension Brother     General:  Healthy appearing patient in no distress.    Eyes:    Acuity  cc OD 20/30  OS 20/20  External: Within normal limits     Anterior segment: Within normal limits     Motility:   XT=55, XT'=60, Rots nl  Fundus: deferred  Refraction: 5 D cyl OD  Heart: Regular rate and rhythm without murmur     Lungs: Clear to auscultation       Impression:Exotropia   Mild amblyopia right eye   Marked astigmatism right eye  Plan: Lateral rectus muscle recession both eyes  Patient plans LASIK after strabismus surgery  Shara BlazingYOUNG,Dabney Dever O

## 2017-04-28 NOTE — Discharge Instructions (Signed)
Diet: Clear liquids, advance to soft foods then regular diet as tolerated by this evening. ° °Pain control:  ° 1)  Ibuprofen 600 mg by mouth every 6-8 hours as needed for pain ° 2)  Ice pack/cold compress to operated eye(s) as desired ° °Eye medications:   Tobradex or Zylet eye ointment 1/2 inch in operated eye(s) twice a day if directed to do so by Dr. Young ° °Activity: No swimming for 1 week.  It is OK to let water run over the face and eyes while showering or taking a bath, even during the first week.  No other restriction on exercise or activity. ° °If there is a patch on one eye, leave it in place until seen in Dr. Young's office this afternoon for suture adjustment.  If there is no patch, you do not need to come to the office this afternoon; just come for your scheduled postop appointment. ° °Call Dr. Young's office [phone] with any problems or concerns. ° ° °Post Anesthesia Home Care Instructions ° °Activity: °Get plenty of rest for the remainder of the day. A responsible individual must stay with you for 24 hours following the procedure.  °For the next 24 hours, DO NOT: °-Drive a car °-Operate machinery °-Drink alcoholic beverages °-Take any medication unless instructed by your physician °-Make any legal decisions or sign important papers. ° °Meals: °Start with liquid foods such as gelatin or soup. Progress to regular foods as tolerated. Avoid greasy, spicy, heavy foods. If nausea and/or vomiting occur, drink only clear liquids until the nausea and/or vomiting subsides. Call your physician if vomiting continues. ° °Special Instructions/Symptoms: °Your throat may feel dry or sore from the anesthesia or the breathing tube placed in your throat during surgery. If this causes discomfort, gargle with warm salt water. The discomfort should disappear within 24 hours. ° °If you had a scopolamine patch placed behind your ear for the management of post- operative nausea and/or vomiting: ° °1. The medication in  the patch is effective for 72 hours, after which it should be removed.  Wrap patch in a tissue and discard in the trash. Wash hands thoroughly with soap and water. °2. You may remove the patch earlier than 72 hours if you experience unpleasant side effects which may include dry mouth, dizziness or visual disturbances. °3. Avoid touching the patch. Wash your hands with soap and water after contact with the patch. °  ° °

## 2017-04-28 NOTE — Transfer of Care (Signed)
Immediate Anesthesia Transfer of Care Note  Patient: Mario Kim  Procedure(s) Performed: Procedure(s): REPAIR STRABISMUS BILATERAL (Bilateral)  Patient Location: PACU  Anesthesia Type:General  Level of Consciousness: sedated  Airway & Oxygen Therapy: Patient Spontanous Breathing and Patient connected to face mask oxygen  Post-op Assessment: Report given to RN and Post -op Vital signs reviewed and stable  Post vital signs: Reviewed and stable  Last Vitals:  Vitals:   04/28/17 0831  BP: 123/84  Pulse: (!) 58  Resp: 18  Temp: 36.6 C    Last Pain:  Vitals:   04/28/17 0831  TempSrc: Oral      Patients Stated Pain Goal: 0 (04/28/17 0831)  Complications: No apparent anesthesia complications

## 2017-04-28 NOTE — Anesthesia Preprocedure Evaluation (Addendum)
Anesthesia Evaluation  Patient identified by MRN, date of birth, ID band Patient awake    Reviewed: Allergy & Precautions, NPO status , Patient's Chart, lab work & pertinent test results  Airway Mallampati: I  TM Distance: >3 FB Neck ROM: Full    Dental   Pulmonary Current Smoker,    Pulmonary exam normal        Cardiovascular Normal cardiovascular exam     Neuro/Psych    GI/Hepatic   Endo/Other    Renal/GU      Musculoskeletal   Abdominal   Peds  Hematology   Anesthesia Other Findings   Reproductive/Obstetrics                            Anesthesia Physical Anesthesia Plan  ASA: II  Anesthesia Plan: General   Post-op Pain Management:    Induction: Intravenous  Airway Management Planned: LMA  Additional Equipment:   Intra-op Plan:   Post-operative Plan:   Informed Consent: I have reviewed the patients History and Physical, chart, labs and discussed the procedure including the risks, benefits and alternatives for the proposed anesthesia with the patient or authorized representative who has indicated his/her understanding and acceptance.   Dental advisory given  Plan Discussed with: CRNA and Surgeon  Anesthesia Plan Comments:        Anesthesia Quick Evaluation

## 2017-04-28 NOTE — Anesthesia Postprocedure Evaluation (Signed)
Anesthesia Post Note  Patient: Mario Kim  Procedure(s) Performed: Procedure(s) (LRB): REPAIR STRABISMUS BILATERAL (Bilateral)  Patient location during evaluation: PACU Anesthesia Type: General Level of consciousness: awake and alert Pain management: pain level controlled Vital Signs Assessment: post-procedure vital signs reviewed and stable Respiratory status: spontaneous breathing, nonlabored ventilation, respiratory function stable and patient connected to nasal cannula oxygen Cardiovascular status: blood pressure returned to baseline and stable Postop Assessment: no signs of nausea or vomiting Anesthetic complications: no       Last Vitals:  Vitals:   04/28/17 1215 04/28/17 1236  BP: 130/78 (!) 135/94  Pulse: 87 74  Resp: 14 20  Temp:  36.7 C    Last Pain:  Vitals:   04/28/17 1236  TempSrc:   PainSc: 3                  Mario Kim

## 2017-04-28 NOTE — Interval H&P Note (Signed)
History and Physical Interval Note:  04/28/2017 9:41 AM  Mario Kim  has presented today for surgery, with the diagnosis of exotropia  The various methods of treatment have been discussed with the patient and family. After consideration of risks, benefits and other options for treatment, the patient has consented to  Procedure(s): REPAIR STRABISMUS BILATERAL (Bilateral) as a surgical intervention .  The patient's history has been reviewed, patient examined, no change in status, stable for surgery.  I have reviewed the patient's chart and labs.  Questions were answered to the patient's satisfaction.     Shara BlazingYOUNG,Mario Kim

## 2017-04-28 NOTE — Op Note (Signed)
04/28/2017  11:20 AM  PATIENT:  Jiles ProwsMichael R Altice  36 y.o. male  PRE-OPERATIVE DIAGNOSIS:  Exotropia      POST-OPERATIVE DIAGNOSIS:  Exotropia     PROCEDURE:  Lateral rectus muscle recession 10.0 mm both eyes  SURGEON:  Pasty SpillersWilliam O.Maple HudsonYoung, M.D.   ANESTHESIA:   general  COMPLICATIONS:None  DESCRIPTION OF PROCEDURE: The patient was taken to the operating room where He was identified by me. General anesthesia was induced without difficulty after placement of appropriate monitors. The patient was prepped and draped in standard sterile fashion. A lid speculum was placed in the right eye.  Through an inferotemporal fornix incision through conjunctiva and Tenon's fascia, the right lateral rectus muscle was engaged on a series of muscle hooks and cleared of its fascial attachments. The tendon was secured with a double-armed 6-0 Vicryl suture with a double locking bite at each border of the muscle, 1 mm from the insertion. The muscle was disinserted, and was reattached to sclera at a measured distance of 10.0 millimeters posterior to the original insertion, using direct scleral passes in crossed swords fashion.  The suture ends were tied securely after the position of the muscle had been checked and found to be accurate. Conjunctiva was closed with 2 6-0 Vicryl sutures.  The speculum was transferred to the left eye, where an identical procedure was performed, again effecting a 10.0 millimeters recession of the lateral rectus muscle. TobraDex ointment was placed in both eyes. The patient was awakened without difficulty and taken to the recovery room in stable condition, having suffered no intraoperative or immediate postoperative complications.  Pasty SpillersWilliam O. Kassidee Narciso M.D.    PATIENT DISPOSITION:  PACU - hemodynamically stable.

## 2017-07-05 DIAGNOSIS — H5010 Unspecified exotropia: Secondary | ICD-10-CM

## 2017-07-05 HISTORY — DX: Unspecified exotropia: H50.10

## 2017-07-07 ENCOUNTER — Encounter (HOSPITAL_BASED_OUTPATIENT_CLINIC_OR_DEPARTMENT_OTHER): Payer: Self-pay | Admitting: *Deleted

## 2017-07-11 ENCOUNTER — Ambulatory Visit: Payer: Self-pay | Admitting: Ophthalmology

## 2017-07-11 NOTE — H&P (Signed)
Date of examination:  06-15-17  Indication for surgery: to straighten the eyes and preserve binocularity  Pertinent past medical history:  Past Medical History:  Diagnosis Date  . 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 Asthma Mother   . Hyperlipidemia Brother   . Hypertension Brother     General:  Healthy appearing patient in no distress.    Eyes:    Acuity  cc OD 20/30  OS 20/20  External: Within normal limits     Anterior segment: Within normal limits  X healed conj scars  Motility:   X(T)=25, X(T)'=30, "X" pattern  Fundus: deferred  Refraction:  5D cyl OD, approx plano OS  Heart: Regular rate and rhythm without murmur     Lungs: Clear to auscultation     Impression:Residual exotropia s/p large LR recess OU  Plan: MR resect OU, no obliques  Venda Dice O  

## 2017-07-14 ENCOUNTER — Ambulatory Visit (HOSPITAL_BASED_OUTPATIENT_CLINIC_OR_DEPARTMENT_OTHER)
Admission: RE | Admit: 2017-07-14 | Discharge: 2017-07-14 | Disposition: A | Discharge status: Home / Self Care | Payer: 59 | Source: Ambulatory Visit | Attending: Ophthalmology | Admitting: Ophthalmology

## 2017-07-14 ENCOUNTER — Ambulatory Visit (HOSPITAL_BASED_OUTPATIENT_CLINIC_OR_DEPARTMENT_OTHER): Payer: 59 | Admitting: Anesthesiology

## 2017-07-14 ENCOUNTER — Encounter (HOSPITAL_BASED_OUTPATIENT_CLINIC_OR_DEPARTMENT_OTHER): Payer: Self-pay

## 2017-07-14 ENCOUNTER — Encounter (HOSPITAL_BASED_OUTPATIENT_CLINIC_OR_DEPARTMENT_OTHER)
Admission: RE | Disposition: A | Discharge status: Home / Self Care | Payer: Self-pay | Source: Ambulatory Visit | Attending: Ophthalmology

## 2017-07-14 DIAGNOSIS — F17200 Nicotine dependence, unspecified, uncomplicated: Secondary | ICD-10-CM | POA: Diagnosis not present

## 2017-07-14 DIAGNOSIS — Z8249 Family history of ischemic heart disease and other diseases of the circulatory system: Secondary | ICD-10-CM | POA: Insufficient documentation

## 2017-07-14 DIAGNOSIS — H5010 Unspecified exotropia: Secondary | ICD-10-CM | POA: Diagnosis present

## 2017-07-14 HISTORY — DX: Unspecified exotropia: H50.10

## 2017-07-14 HISTORY — PX: STRABISMUS SURGERY: SHX218

## 2017-07-14 SURGERY — STRABISMUS SURGERY, BILATERAL
Anesthesia: General | Site: Eye | Laterality: Bilateral

## 2017-07-14 MED ORDER — ONDANSETRON HCL 4 MG/2ML IJ SOLN
INTRAMUSCULAR | Status: AC
  Filled 2017-07-14: qty 2

## 2017-07-14 MED ORDER — PROPOFOL 10 MG/ML IV BOLUS
INTRAVENOUS | Status: AC
  Filled 2017-07-14: qty 20

## 2017-07-14 MED ORDER — DEXAMETHASONE SODIUM PHOSPHATE 10 MG/ML IJ SOLN
INTRAMUSCULAR | Status: AC
  Filled 2017-07-14: qty 1

## 2017-07-14 MED ORDER — LACTATED RINGERS IV SOLN
INTRAVENOUS | Status: DC
  Administered 2017-07-14: 11:00:00 via INTRAVENOUS

## 2017-07-14 MED ORDER — HYDROMORPHONE HCL 1 MG/ML IJ SOLN: 0.2500 mg | INTRAMUSCULAR | Status: DC | PRN

## 2017-07-14 MED ORDER — ATROPINE SULFATE 0.4 MG/ML IJ SOLN
INTRAMUSCULAR | Status: DC | PRN
  Administered 2017-07-14: .4 mg via INTRAVENOUS

## 2017-07-14 MED ORDER — ONDANSETRON HCL 4 MG/2ML IJ SOLN: 4.0000 mg | Freq: Once | INTRAMUSCULAR | Status: DC | PRN

## 2017-07-14 MED ORDER — TOBRAMYCIN-DEXAMETHASONE 0.3-0.1 % OP OINT
TOPICAL_OINTMENT | OPHTHALMIC | Status: DC | PRN
  Administered 2017-07-14: 1 via OPHTHALMIC

## 2017-07-14 MED ORDER — SCOPOLAMINE 1 MG/3DAYS TD PT72
1.0000 | MEDICATED_PATCH | Freq: Once | TRANSDERMAL | Status: DC | PRN
Start: 2017-07-14 — End: 2017-07-14

## 2017-07-14 MED ORDER — MIDAZOLAM HCL 2 MG/2ML IJ SOLN
INTRAMUSCULAR | Status: AC
Start: 2017-07-14 — End: 2017-07-14
  Filled 2017-07-14: qty 2

## 2017-07-14 MED ORDER — KETOROLAC TROMETHAMINE 30 MG/ML IJ SOLN
INTRAMUSCULAR | Status: DC | PRN
  Administered 2017-07-14: 30 mg via INTRAVENOUS

## 2017-07-14 MED ORDER — FENTANYL CITRATE (PF) 100 MCG/2ML IJ SOLN
INTRAMUSCULAR | Status: AC
  Filled 2017-07-14: qty 2

## 2017-07-14 MED ORDER — MEPERIDINE HCL 25 MG/ML IJ SOLN: 6.2500 mg | INTRAMUSCULAR | Status: DC | PRN

## 2017-07-14 MED ORDER — LIDOCAINE HCL (CARDIAC) 20 MG/ML IV SOLN
INTRAVENOUS | Status: DC | PRN
  Administered 2017-07-14: 40 mg via INTRAVENOUS

## 2017-07-14 MED ORDER — FENTANYL CITRATE (PF) 100 MCG/2ML IJ SOLN
50.0000 ug | INTRAMUSCULAR | Status: AC | PRN
  Administered 2017-07-14: 100 ug via INTRAVENOUS
  Administered 2017-07-14 (×2): 50 ug via INTRAVENOUS

## 2017-07-14 MED ORDER — DEXAMETHASONE SODIUM PHOSPHATE 4 MG/ML IJ SOLN
INTRAMUSCULAR | Status: DC | PRN
  Administered 2017-07-14: 10 mg via INTRAVENOUS

## 2017-07-14 MED ORDER — TOBRAMYCIN-DEXAMETHASONE 0.3-0.1 % OP OINT
TOPICAL_OINTMENT | OPHTHALMIC | Status: AC
  Filled 2017-07-14: qty 3.5

## 2017-07-14 MED ORDER — MIDAZOLAM HCL 2 MG/2ML IJ SOLN
1.0000 mg | INTRAMUSCULAR | Status: DC | PRN
  Administered 2017-07-14: 2 mg via INTRAVENOUS

## 2017-07-14 MED ORDER — TOBRAMYCIN-DEXAMETHASONE 0.3-0.1 % OP OINT: 1.0000 "application " | TOPICAL_OINTMENT | Freq: Two times a day (BID) | OPHTHALMIC | 0 refills | Status: DC

## 2017-07-14 MED ORDER — PROPOFOL 10 MG/ML IV BOLUS
INTRAVENOUS | Status: DC | PRN
  Administered 2017-07-14: 200 mg via INTRAVENOUS

## 2017-07-14 MED ORDER — LIDOCAINE 2% (20 MG/ML) 5 ML SYRINGE
INTRAMUSCULAR | Status: AC
  Filled 2017-07-14: qty 5

## 2017-07-14 SURGICAL SUPPLY — 36 items
APPLICATOR COTTON TIP 6IN STRL (MISCELLANEOUS) ×12 IMPLANT
APPLICATOR DR MATTHEWS STRL (MISCELLANEOUS) ×3 IMPLANT
BANDAGE EYE OVAL (MISCELLANEOUS) IMPLANT
CAUTERY EYE LOW TEMP 1300F FIN (OPHTHALMIC RELATED) IMPLANT
CLOSURE WOUND 1/4X4 (GAUZE/BANDAGES/DRESSINGS)
COVER BACK TABLE 60X90IN (DRAPES) ×3 IMPLANT
COVER MAYO STAND STRL (DRAPES) ×3 IMPLANT
DRAPE SURG 17X23 STRL (DRAPES) ×6 IMPLANT
DRAPE U-SHAPE 76X120 STRL (DRAPES) ×3 IMPLANT
GLOVE BIO SURGEON STRL SZ 6.5 (GLOVE) ×4 IMPLANT
GLOVE BIO SURGEONS STRL SZ 6.5 (GLOVE) ×2
GLOVE BIOGEL M STRL SZ7.5 (GLOVE) ×3 IMPLANT
GLOVE BIOGEL PI IND STRL 7.0 (GLOVE) ×1 IMPLANT
GLOVE BIOGEL PI INDICATOR 7.0 (GLOVE) ×2
GLOVE SURG SYN 7.5  E (GLOVE) ×2
GLOVE SURG SYN 7.5 E (GLOVE) ×1 IMPLANT
GOWN STRL REUS W/ TWL LRG LVL3 (GOWN DISPOSABLE) ×1 IMPLANT
GOWN STRL REUS W/TWL LRG LVL3 (GOWN DISPOSABLE) ×2
GOWN STRL REUS W/TWL XL LVL3 (GOWN DISPOSABLE) ×6 IMPLANT
NS IRRIG 1000ML POUR BTL (IV SOLUTION) ×3 IMPLANT
PACK BASIN DAY SURGERY FS (CUSTOM PROCEDURE TRAY) ×3 IMPLANT
SHEET MEDIUM DRAPE 40X70 STRL (DRAPES) IMPLANT
SLEEVE SURGEON STRL (DRAPES) ×3 IMPLANT
SPEAR EYE SURG WECK-CEL (MISCELLANEOUS) ×6 IMPLANT
STRIP CLOSURE SKIN 1/4X4 (GAUZE/BANDAGES/DRESSINGS) IMPLANT
SUT 6 0 SILK T G140 8DA (SUTURE) IMPLANT
SUT MERSILENE 6-0 18IN S14 8MM (SUTURE)
SUT PLAIN 6 0 TG1408 (SUTURE) ×3 IMPLANT
SUT SILK 4 0 C 3 735G (SUTURE) IMPLANT
SUT VICRYL 6 0 S 28 (SUTURE) ×6 IMPLANT
SUT VICRYL ABS 6-0 S29 18IN (SUTURE) IMPLANT
SUTURE MERSLN 6-0 18IN S14 8MM (SUTURE) IMPLANT
SYR 10ML LL (SYRINGE) ×3 IMPLANT
SYR TB 1ML LL NO SAFETY (SYRINGE) ×3 IMPLANT
TOWEL OR 17X24 6PK STRL BLUE (TOWEL DISPOSABLE) ×3 IMPLANT
TRAY DSU PREP LF (CUSTOM PROCEDURE TRAY) ×3 IMPLANT

## 2017-07-14 NOTE — Interval H&P Note (Signed)
History and Physical Interval Note:  07/14/2017 12:42 PM  Jiles ProwsMichael R Wirsing  has presented today for surgery, with the diagnosis of exotropia  The various methods of treatment have been discussed with the patient and family. After consideration of risks, benefits and other options for treatment, the patient has consented to  Procedure(s): REPAIR STRABISMUS BILATERAL (Bilateral) as a surgical intervention .  The patient's history has been reviewed, patient examined, no change in status, stable for surgery.  I have reviewed the patient's chart and labs.  Questions were answered to the patient's satisfaction.     Shara BlazingYOUNG,Miray Mancino O

## 2017-07-14 NOTE — Transfer of Care (Signed)
Immediate Anesthesia Transfer of Care Note  Patient: Mario Kim  Procedure(s) Performed: Procedure(s): BILATERAL STRABISMUS REPAIR (Bilateral)  Patient Location: PACU  Anesthesia Type:General  Level of Consciousness: awake and sedated  Airway & Oxygen Therapy: Patient Spontanous Breathing and Patient connected to face mask oxygen  Post-op Assessment: Report given to RN and Post -op Vital signs reviewed and stable  Post vital signs: Reviewed and stable  Last Vitals:  Vitals:   07/14/17 1404 07/14/17 1405  BP:    Pulse: 95 84  Resp: 19 15  Temp: 36.5 C   SpO2: 94% 94%    Last Pain:  Vitals:   07/14/17 1030  TempSrc: Oral         Complications: No apparent anesthesia complications

## 2017-07-14 NOTE — Anesthesia Postprocedure Evaluation (Signed)
Anesthesia Post Note  Patient: Mario Kim  Procedure(s) Performed: Procedure(s) (LRB): BILATERAL STRABISMUS REPAIR (Bilateral)     Patient location during evaluation: PACU Anesthesia Type: General Level of consciousness: awake and alert Pain management: pain level controlled Vital Signs Assessment: post-procedure vital signs reviewed and stable Respiratory status: spontaneous breathing, nonlabored ventilation, respiratory function stable and patient connected to nasal cannula oxygen Cardiovascular status: blood pressure returned to baseline and stable Postop Assessment: no signs of nausea or vomiting Anesthetic complications: no    Last Vitals:  Vitals:   07/14/17 1417 07/14/17 1430  BP:    Pulse: 64 77  Resp: 19 14  Temp:    SpO2: 98% 90%    Last Pain:  Vitals:   07/14/17 1030  TempSrc: Oral                 Renel Ende DAVID

## 2017-07-14 NOTE — Anesthesia Preprocedure Evaluation (Signed)
Anesthesia Evaluation  Patient identified by MRN, date of birth, ID band Patient awake    Reviewed: Allergy & Precautions, NPO status , Patient's Chart, lab work & pertinent test results  Airway Mallampati: II  TM Distance: >3 FB Neck ROM: Full    Dental   Pulmonary Current Smoker,    Pulmonary exam normal        Cardiovascular Normal cardiovascular exam     Neuro/Psych    GI/Hepatic   Endo/Other    Renal/GU      Musculoskeletal   Abdominal   Peds  Hematology   Anesthesia Other Findings   Reproductive/Obstetrics                             Anesthesia Physical Anesthesia Plan  ASA: II  Anesthesia Plan: General   Post-op Pain Management:    Induction: Intravenous  PONV Risk Score and Plan: 1 and Ondansetron and Dexamethasone  Airway Management Planned: LMA  Additional Equipment:   Intra-op Plan:   Post-operative Plan: Extubation in OR  Informed Consent: I have reviewed the patients History and Physical, chart, labs and discussed the procedure including the risks, benefits and alternatives for the proposed anesthesia with the patient or authorized representative who has indicated his/her understanding and acceptance.     Plan Discussed with: CRNA and Surgeon  Anesthesia Plan Comments:         Anesthesia Quick Evaluation

## 2017-07-14 NOTE — Discharge Instructions (Signed)
Diet: Clear liquids, advance to soft foods then regular diet as tolerated by this evening. ° °Pain control:  ° 1)  Ibuprofen 600 mg by mouth every 6-8 hours as needed for pain ° 2)  Ice pack/cold compress to operated eye(s) as desired ° °Eye medications:  Tobradex or Zylet eye ointment 1/2 inch in operated eye(s) twice a day if directed to do so by Dr. Young ° °Activity: No swimming for 1 week.  It is OK to let water run over the face and eyes while showering or taking a bath, even during the first week.  No other restriction on exercise or activity. ° ° °Call Dr. Young's office [phone] with any problems or concerns. ° ° ° ° °Post Anesthesia Home Care Instructions ° °Activity: °Get plenty of rest for the remainder of the day. A responsible individual must stay with you for 24 hours following the procedure.  °For the next 24 hours, DO NOT: °-Drive a car °-Operate machinery °-Drink alcoholic beverages °-Take any medication unless instructed by your physician °-Make any legal decisions or sign important papers. ° °Meals: °Start with liquid foods such as gelatin or soup. Progress to regular foods as tolerated. Avoid greasy, spicy, heavy foods. If nausea and/or vomiting occur, drink only clear liquids until the nausea and/or vomiting subsides. Call your physician if vomiting continues. ° °Special Instructions/Symptoms: °Your throat may feel dry or sore from the anesthesia or the breathing tube placed in your throat during surgery. If this causes discomfort, gargle with warm salt water. The discomfort should disappear within 24 hours. ° °If you had a scopolamine patch placed behind your ear for the management of post- operative nausea and/or vomiting: ° °1. The medication in the patch is effective for 72 hours, after which it should be removed.  Wrap patch in a tissue and discard in the trash. Wash hands thoroughly with soap and water. °2. You may remove the patch earlier than 72 hours if you experience unpleasant  side effects which may include dry mouth, dizziness or visual disturbances. °3. Avoid touching the patch. Wash your hands with soap and water after contact with the patch. °   ° °

## 2017-07-14 NOTE — H&P (View-Only) (Signed)
Date of examination:  06-15-17  Indication for surgery: to straighten the eyes and preserve binocularity  Pertinent past medical history:  Past Medical History:  Diagnosis Date  . 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 Asthma Mother   . Hyperlipidemia Brother   . Hypertension Brother     General:  Healthy appearing patient in no distress.    Eyes:    Acuity  cc OD 20/30  OS 20/20  External: Within normal limits     Anterior segment: Within normal limits  X healed conj scars  Motility:   X(T)=25, X(T)'=30, "X" pattern  Fundus: deferred  Refraction:  5D cyl OD, approx plano OS  Heart: Regular rate and rhythm without murmur     Lungs: Clear to auscultation     Impression:Residual exotropia s/p large LR recess OU  Plan: MR resect OU, no obliques  Mario Kim O

## 2017-07-14 NOTE — Anesthesia Procedure Notes (Signed)
Procedure Name: LMA Insertion Performed by: Dore Oquin W Pre-anesthesia Checklist: Patient identified, Emergency Drugs available, Suction available and Patient being monitored Patient Re-evaluated:Patient Re-evaluated prior to induction Oxygen Delivery Method: Circle system utilized Preoxygenation: Pre-oxygenation with 100% oxygen Induction Type: IV induction Ventilation: Mask ventilation without difficulty LMA: LMA flexible inserted LMA Size: 4.0 Number of attempts: 1 Placement Confirmation: positive ETCO2 Tube secured with: Tape Dental Injury: Teeth and Oropharynx as per pre-operative assessment        

## 2017-07-14 NOTE — Op Note (Signed)
07/14/2017  1:55 PM  PATIENT:  Mario Kim    PRE-OPERATIVE DIAGNOSIS:  Exotropia, residual/recurrent  POST-OPERATIVE DIAGNOSIS:  Exotropia, residual/recurrent  PROCEDURE:  Medial rectus muscle resection, 6.0 mm both eye(s)  SURGEON:  Shara BlazingYOUNG,Iliana Hutt O, MD  ANESTHESIA:   General  COMPLICATIONS:none  OPERATIVE PROCEDURE: After routine preoperative evaluation including informed consent from the parent, the patient was taken to the operative room where He was identified by me. General anesthesia was induced without difficulty after placement of appropriate monitors. The patient was prepped and draped in standard sterile fashion. A lid speculum was placed in the left eye.  Through an inferonasal fornix incision through conjunctiva and Tenon's fascia, the left medial rectus muscle was engaged on a series of muscle hooks and carefully cleared of its fascial attachments to at least 15 mm posterior to the insertion. The muscle was spread between 2 self-retaining hooks. A 2 mm bite was taken of the center of the muscle belly at a measured distance of 6.0 mm posterior to the insertion, and a knot was tied securely at this location. The needle at each end of the double-armed suture was passed from the center of the muscle belly to the periphery, parallel to and 6.0 mm posterior to the insertion. A locking bite was placed at each border of the muscle. A resection clamp was placed on the muscle just anterior to the sutures. The muscle was disinserted. Each pole suture was passed posteriorly to anteriorly through the corresponding end of the muscle stump, then anteriorly to posteriorly near the center of the stump, then posteriorly to anteriorly through the center of the muscle belly, just posterior to the previously placed knot. The muscle was drawn up to the level of the original insertion. The clamp was removed. The suture ends were tied securely after all slackened and removed. The portion of the muscle  anterior to the sutures was carefully excised. Conjunctiva was closed with a single 6-0 plain git suture.  The speculum was transferred to the right eye, where an identical procedure was performed, again effecting a 6.0 mm resection of the medial rectus muscle. Tobrex ophthalmic ointment was placed in the both eye(s). The patient was awakened without difficulty and taken to the recovery room in stable condition, having suffered no intraoperative or immediate postoperative competitions.  Shara BlazingYOUNG,Deoni Cosey O, MD

## 2017-07-17 ENCOUNTER — Encounter (HOSPITAL_BASED_OUTPATIENT_CLINIC_OR_DEPARTMENT_OTHER): Payer: Self-pay | Admitting: Ophthalmology

## 2020-04-06 ENCOUNTER — Other Ambulatory Visit: Payer: Self-pay

## 2020-04-06 ENCOUNTER — Ambulatory Visit
Admission: EM | Admit: 2020-04-06 | Discharge: 2020-04-06 | Disposition: A | Discharge status: Home / Self Care | Payer: 59 | Attending: Emergency Medicine | Admitting: Emergency Medicine

## 2020-04-06 DIAGNOSIS — M545 Low back pain, unspecified: Secondary | ICD-10-CM

## 2020-04-06 MED ORDER — CYCLOBENZAPRINE HCL 10 MG PO TABS: 10.0000 mg | ORAL_TABLET | Freq: Two times a day (BID) | ORAL | 0 refills | Status: DC | PRN

## 2020-04-06 MED ORDER — IBUPROFEN 800 MG PO TABS: 800.0000 mg | ORAL_TABLET | Freq: Three times a day (TID) | ORAL | 0 refills | Status: DC | PRN

## 2020-04-06 NOTE — Discharge Instructions (Signed)
Take the prescribed ibuprofen as needed for your pain.  Take the muscle relaxer Flexeril as needed for muscle spasm; do not drive, operate machinery, or drink alcohol with this medication as it may make you drowsy.    Follow up with your PCP or an orthopedist if your pain is not improving.

## 2020-04-06 NOTE — ED Provider Notes (Signed)
[name]    CSN: [id number] Arrival date & time: 04/06/20  2774      History   Chief Complaint Chief Complaint  Patient presents with  . Back Pain    HPI Mario Kim is a 39 y.o. male.   Patient presents with left lower back pain x2 days.  He has a history of lumbar disc surgery and has occasional flares of this pain.  He denies numbness, tingling, weakness in his LE.  He denies fever, chills, rash, lesions, redness, increased warmth, bruising, or other symptoms.  Treatment attempted at home with OTC pain medication.    The history is provided by the patient.    Past Medical History:  Diagnosis Date  . 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   Past Surgical History:  Procedure Laterality Date  . LUMBAR DISC SURGERY     L4-5  . STRABISMUS SURGERY Bilateral 04/28/2017   Procedure: REPAIR STRABISMUS BILATERAL;  Surgeon: Verne Carrow, MD;  Location: [facility];  Service: Ophthalmology;  Laterality: Bilateral;  . STRABISMUS SURGERY Bilateral 07/14/2017   Procedure: BILATERAL STRABISMUS REPAIR;  Surgeon: Verne Carrow, MD;  Location: [facility];  Service: Ophthalmology;  Laterality: Bilateral;       Home Medications    Prior to Admission medications   Medication Sig Start Date End Date Taking? Authorizing Provider  cyclobenzaprine (FLEXERIL) 10 MG tablet Take 1 tablet (10 mg total) by mouth 2 (two) times daily as needed for muscle spasms. 04/06/20   Mickie Bail, NP  ibuprofen (ADVIL) 800 MG tablet Take 1 tablet (800 mg total) by mouth every 8 (eight) hours as needed. 04/06/20   Mickie Bail, NP  tobramycin-dexamethasone [facility]) ophthalmic ointment Place 1 application into both eyes 2 (two) times daily. 07/14/17   Verne Carrow, MD    Family History Family History  Problem Relation Age of Onset  . Asthma Mother   . Hyperlipidemia Brother   . Hypertension Brother      Social History Social History   Tobacco Use  . Smoking status: Current Every Day Smoker    Packs/day: 1.50    Years: 20.00    Pack years: 30.00    Types: Cigarettes  . Smokeless tobacco: Never Used  Substance Use Topics  . Alcohol use: Yes    Comment: rarely  . Drug use: No     Allergies   Patient has no known allergies.   Review of Systems Review of Systems  Constitutional: Negative for chills and fever.  HENT: Negative for ear pain and sore throat.   Eyes: Negative for pain and visual disturbance.  Respiratory: Negative for cough and shortness of breath.   Cardiovascular: Negative for chest pain and palpitations.  Gastrointestinal: Negative for abdominal pain and vomiting.  Genitourinary: Negative for dysuria and hematuria.  Musculoskeletal: Positive for back pain. Negative for arthralgias and gait problem.  Skin: Negative for color change and rash.  Neurological: Negative for seizures, syncope, weakness and numbness.  All other systems reviewed and are negative.    Physical Exam Triage Vital Signs ED Triage Vitals [04/06/20 0924]  Enc Vitals Group     BP      Pulse      Resp      Temp      Temp src      SpO2      Weight 290 lb (131.5 kg)  Height 6\' 3"  (1.905 m)     Head Circumference      Peak Flow      Pain Score 8     Pain Loc      Pain Edu?      Excl. in GC?    No data found.  Updated Vital Signs BP (!) 144/82 (BP Location: Left Arm)   Pulse 79   Temp 98.8 F (37.1 C) (Oral)   Resp 18   Ht 6\' 3"  (1.905 m)   Wt 290 lb (131.5 kg)   SpO2 96%   BMI 36.25 kg/m   Visual Acuity Right Eye Distance:   Left Eye Distance:   Bilateral Distance:    Right Eye Near:   Left Eye Near:    Bilateral Near:     Physical Exam Vitals and nursing note reviewed.  Constitutional:      General: He is not in acute distress.    Appearance: He is well-developed. He is not ill-appearing.  HENT:     Head: Normocephalic and atraumatic.     Mouth/Throat:      Mouth: Mucous membranes are moist.  Eyes:     Conjunctiva/sclera: Conjunctivae normal.  Cardiovascular:     Rate and Rhythm: Normal rate and regular rhythm.     Heart sounds: No murmur.  Pulmonary:     Effort: Pulmonary effort is normal. No respiratory distress.     Breath sounds: Normal breath sounds.  Abdominal:     Palpations: Abdomen is soft.     Tenderness: There is no abdominal tenderness. There is no guarding or rebound.  Musculoskeletal:        General: No swelling or deformity. Normal range of motion.     Cervical back: Neck supple.     Right lower leg: No edema.     Left lower leg: No edema.  Skin:    General: Skin is warm and dry.     Capillary Refill: Capillary refill takes less than 2 seconds.     Findings: No bruising, erythema, lesion or rash.  Neurological:     General: No focal deficit present.     Mental Status: He is alert and oriented to person, place, and time.     Sensory: No sensory deficit.     Motor: No weakness.     Gait: Gait normal.  Psychiatric:        Mood and Affect: Mood normal.        Behavior: Behavior normal.      UC Treatments / Results  Labs (all labs ordered are listed, but only abnormal results are displayed) Labs Reviewed - No data to display  EKG   Radiology No results found.  Procedures Procedures (including critical care time)  Medications Ordered in UC Medications - No data to display  Initial Impression / Assessment and Plan / UC Course  I have reviewed the triage vital signs and the nursing notes.  Pertinent labs & imaging results that were available during my care of the patient were reviewed by me and considered in my medical decision making (see chart for details).   Left lower back pain without sciatica.  Treating with ibuprofen and Flexeril.  Precautions for drowsiness with Flexeril discussed with patient.  Instructed him to follow-up with his PCP or orthopedist if his pain is not improving.  Patient agrees  to plan of care.     Final Clinical Impressions(s) / UC Diagnoses   Final diagnoses:  Left-sided low back  pain without sciatica, unspecified chronicity     Discharge Instructions     Take the prescribed ibuprofen as needed for your pain.  Take the muscle relaxer Flexeril as needed for muscle spasm; do not drive, operate machinery, or drink alcohol with this medication as it may make you drowsy.    Follow up with your PCP or an orthopedist if your pain is not improving.        ED Prescriptions    Medication Sig Dispense Auth. Provider   ibuprofen (ADVIL) 800 MG tablet Take 1 tablet (800 mg total) by mouth every 8 (eight) hours as needed. 21 tablet Sharion Balloon, NP   cyclobenzaprine (FLEXERIL) 10 MG tablet Take 1 tablet (10 mg total) by mouth 2 (two) times daily as needed for muscle spasms. 20 tablet Sharion Balloon, NP     I have reviewed the PDMP during this encounter.   Sharion Balloon, NP 04/06/20 [phone]

## 2020-04-06 NOTE — ED Notes (Signed)
Patient complains of back pain reports that he has left sided lumbar pain. Reports that he had previous surgery on his back and states that this flares up from time to time.

## 2020-04-06 NOTE — ED Triage Notes (Signed)
Patient complains of back pain reports that he has left sided lumbar pain. Reports that he had previous surgery on his back and states that this flares up from time to time.  

## 2020-04-09 ENCOUNTER — Other Ambulatory Visit: Payer: Self-pay

## 2020-04-09 ENCOUNTER — Ambulatory Visit
Admission: EM | Admit: 2020-04-09 | Discharge: 2020-04-09 | Disposition: A | Discharge status: Home / Self Care | Payer: 59

## 2020-04-09 ENCOUNTER — Encounter: Payer: Self-pay | Admitting: Emergency Medicine

## 2020-04-09 DIAGNOSIS — M545 Low back pain, unspecified: Secondary | ICD-10-CM

## 2020-04-09 NOTE — ED Provider Notes (Signed)
Mario Kim    CSN: [id number] Arrival date & time: 04/09/20  1139      History   Chief Complaint Chief Complaint  Patient presents with  . Back Pain    HPI Mario Kim is a 39 y.o. male.   Patient presents with ongoing left lower back pain x 5 days.  The pain is non-radiating, constant, sharp, 8/10.  He was seen here on 04/06/2020 and treated with ibuprofen and Flexeril.  He has a history of chronic back pain with lumbar discectomy in 2008; he states this pain intermittently flares up.  He states he has been unable to work today and needs a note.    The history is provided by the patient.    Past Medical History:  Diagnosis Date  . 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   Past Surgical History:  Procedure Laterality Date  . LUMBAR DISC SURGERY     L4-5  . STRABISMUS SURGERY Bilateral 04/28/2017   Procedure: REPAIR STRABISMUS BILATERAL;  Surgeon: Verne Carrow, MD;  Location: [facility];  Service: Ophthalmology;  Laterality: Bilateral;  . STRABISMUS SURGERY Bilateral 07/14/2017   Procedure: BILATERAL STRABISMUS REPAIR;  Surgeon: Verne Carrow, MD;  Location: [facility];  Service: Ophthalmology;  Laterality: Bilateral;       Home Medications    Prior to Admission medications   Medication Sig Start Date End Date Taking? Authorizing Provider  cyclobenzaprine (FLEXERIL) 10 MG tablet Take 1 tablet (10 mg total) by mouth 2 (two) times daily as needed for muscle spasms. 04/06/20  Yes Mickie Bail, NP  ibuprofen (ADVIL) 800 MG tablet Take 1 tablet (800 mg total) by mouth every 8 (eight) hours as needed. 04/06/20  Yes Mickie Bail, NP  tobramycin-dexamethasone [facility]) ophthalmic ointment Place 1 application into both eyes 2 (two) times daily. 07/14/17   Verne Carrow, MD    Family History Family History  Problem Relation Age of Onset  . Asthma Mother   . Diabetes Mother   .  Hyperlipidemia Brother   . Hypertension Brother   . Other Father        unknown medical history    Social History Social History   Tobacco Use  . Smoking status: Current Every Day Smoker    Packs/day: 1.50    Years: 20.00    Pack years: 30.00    Types: Cigarettes  . Smokeless tobacco: Never Used  Substance Use Topics  . Alcohol use: Yes    Comment: rarely  . Drug use: No     Allergies   Patient has no known allergies.   Review of Systems Review of Systems  Constitutional: Negative for chills and fever.  HENT: Negative for ear pain and sore throat.   Eyes: Negative for pain and visual disturbance.  Respiratory: Negative for cough and shortness of breath.   Cardiovascular: Negative for chest pain and palpitations.  Gastrointestinal: Negative for abdominal pain and vomiting.  Genitourinary: Negative for dysuria and hematuria.  Musculoskeletal: Positive for back pain. Negative for arthralgias.  Skin: Negative for color change and rash.  Neurological: Negative for seizures and syncope.  All other systems reviewed and are negative.    Physical Exam Triage Vital Signs ED Triage Vitals  Enc Vitals Group     BP      Pulse      Resp      Temp  Temp src      SpO2      Weight      Height      Head Circumference      Peak Flow      Pain Score      Pain Loc      Pain Edu?      Excl. in GC?    No data found.  Updated Vital Signs BP (!) 136/97 (BP Location: Left Arm)   Pulse 91   Temp 98.1 F (36.7 C) (Oral)   Resp 18   Ht 6\' 3"  (1.905 m)   Wt 280 lb (127 kg)   SpO2 96%   BMI 35.00 kg/m   Visual Acuity Right Eye Distance:   Left Eye Distance:   Bilateral Distance:    Right Eye Near:   Left Eye Near:    Bilateral Near:     Physical Exam Vitals and nursing note reviewed.  Constitutional:      Appearance: He is well-developed.  HENT:     Head: Normocephalic and atraumatic.     Mouth/Throat:     Mouth: Mucous membranes are moist.  Eyes:      Conjunctiva/sclera: Conjunctivae normal.  Cardiovascular:     Rate and Rhythm: Normal rate and regular rhythm.     Heart sounds: No murmur.  Pulmonary:     Effort: Pulmonary effort is normal. No respiratory distress.     Breath sounds: Normal breath sounds.  Abdominal:     Palpations: Abdomen is soft.     Tenderness: There is no abdominal tenderness.  Musculoskeletal:        General: No swelling, tenderness or deformity. Normal range of motion.     Cervical back: Neck supple.  Skin:    General: Skin is warm and dry.     Findings: No bruising, erythema, lesion or rash.  Neurological:     General: No focal deficit present.     Mental Status: He is alert and oriented to person, place, and time.     Sensory: No sensory deficit.     Motor: No weakness.     Gait: Gait normal.  Psychiatric:        Mood and Affect: Mood normal.        Behavior: Behavior normal.      UC Treatments / Results  Labs (all labs ordered are listed, but only abnormal results are displayed) Labs Reviewed - No data to display  EKG   Radiology No results found.  Procedures Procedures (including critical care time)  Medications Ordered in UC Medications - No data to display  Initial Impression / Assessment and Plan / UC Course  I have reviewed the triage vital signs and the nursing notes.  Pertinent labs & imaging results that were available during my care of the patient were reviewed by me and considered in my medical decision making (see chart for details).   Acute left lower back pain without sciatica.  Instructed patient to continue ibuprofen and Flexeril as previously directed.  Precautions for drowsiness with Flexeril discussed.  Work note provided.  Instructed patient to follow-up with orthopedics if his back pain is not improving.  Patient agrees to plan of care.      Final Clinical Impressions(s) / UC Diagnoses   Final diagnoses:  Acute left-sided low back pain without sciatica      Discharge Instructions     Continue taking the ibuprofen and Flexeril as previously directed.    Follow  up with Orthopedics if your symptoms persist.        ED Prescriptions    None     I have reviewed the PDMP during this encounter.   Sharion Balloon, NP 04/09/20 1212

## 2020-04-09 NOTE — ED Triage Notes (Signed)
Patient in today c/o continued back pain. Patient seen 04/06/20 for same. Patient has been taking Ibuprofen and muscle relaxer prescribed on 04/06/20.

## 2020-04-09 NOTE — Discharge Instructions (Signed)
Continue taking the ibuprofen and Flexeril as previously directed.    Follow up with Orthopedics if your symptoms persist.

## 2021-07-29 ENCOUNTER — Encounter: Payer: Self-pay | Admitting: Unknown Physician Specialty

## 2021-08-04 NOTE — Discharge Instructions (Signed)
[facility] ENDOSCOPIC SINUS SURGERY [facility]  What is Functional Endoscopic Sinus Surgery?  The Surgery involves making the natural openings of the sinuses larger by removing the bony partitions that separate the sinuses from the nasal cavity.  The natural sinus lining is preserved as much as possible to allow the sinuses to resume normal function after the surgery.  In some patients nasal polyps (excessively swollen lining of the sinuses) may be removed to relieve obstruction of the sinus openings.  The surgery is performed through the nose using lighted scopes, which eliminates the need for incisions on the face.  A septoplasty is a different procedure which is sometimes performed with sinus surgery.  It involves straightening the boy partition that separates the two sides of your nose.  A crooked or deviated septum may need repair if is obstructing the sinuses or nasal airflow.  Turbinate reduction is also often performed during sinus surgery.  The turbinates are bony proturberances from the side walls of the nose which swell and can obstruct the nose in patients with sinus and allergy problems.  Their size can be surgically reduced to help relieve nasal obstruction.  What Can Sinus Surgery Do For Me?  Sinus surgery can reduce the frequency of sinus infections requiring antibiotic treatment.  This can provide improvement in nasal congestion, post-nasal drainage, facial pressure and nasal obstruction.  Surgery will NOT prevent you from ever having an infection again, so it usually only for patients who get infections 4 or more times yearly requiring antibiotics, or for infections that do not clear with antibiotics.  It will not cure nasal allergies, so patients with allergies may still require medication to treat their allergies after surgery. Surgery may improve headaches related to sinusitis, however, some people will continue to  require medication to control sinus headaches related to allergies.  Surgery will do nothing for other forms of headache (migraine, tension or cluster).  What Are the Risks of Endoscopic Sinus Surgery?  Current techniques allow surgery to be performed safely with little risk, however, there are rare complications that patients should be aware of.  Because the sinuses are located around the eyes, there is risk of eye injury, including blindness, though again, this would be quite rare. This is usually a result of bleeding behind the eye during surgery, which puts the vision oat risk, though there are treatments to protect the vision and prevent permanent disrupted by surgery causing a leak of the spinal fluid that surrounds the brain.  More serious complications would include bleeding inside the brain cavity or damage to the brain.  Again, all of these complications are uncommon, and spinal fluid leaks can be safely managed surgically if they occur.  The most common complication of sinus surgery is bleeding from the nose, which may require packing or cauterization of the nose.  Continued sinus have polyps may experience recurrence of the polyps requiring revision surgery.  Alterations of sense of smell or injury to the tear ducts are also rare complications.   What is the Surgery Like, and what is the Recovery?  The Surgery usually takes a couple of hours to perform, and is usually performed under a general anesthetic (completely asleep).  Patients are usually discharged home after a couple of hours.  Sometimes during surgery it is necessary to pack the nose to control bleeding, and the packing is left in place for 24 - 48 hours, and removed by your surgeon.    If a septoplasty was performed during the procedure, there is often a splint placed which must be removed after 5-7 days.   Discomfort: Pain is usually mild to moderate, and can be controlled by prescription pain medication or acetaminophen (Tylenol).   Aspirin, Ibuprofen (Advil, Motrin), or Naprosyn (Aleve) should be avoided, as they can cause increased bleeding.  Most patients feel sinus pressure like they have a bad head cold for several days.  Sleeping with your head elevated can help reduce swelling and facial pressure, as can ice packs over the face.  A humidifier may be helpful to keep the mucous and blood from drying in the nose.   Diet: There are no specific diet restrictions, however, you should generally start with clear liquids and a light diet of bland foods because the anesthetic can cause some nausea.  Advance your diet depending on how your stomach feels.  Taking your pain medication with food will often help reduce stomach upset which pain medications can cause.  Nasal Saline Irrigation: It is important to remove blood clots and dried mucous from the nose as it is healing.  This is done by having you irrigate the nose at least 3 - 4 times daily with a salt water solution.  We recommend using NeilMed Sinus Rinse (available at the drug store).  Fill the squeeze bottle with the solution, bend over a sink, and insert the tip of the squeeze bottle into the nose  of an inch.  Point the tip of the squeeze bottle towards the inside corner of the eye on the same side your irrigating.  Squeeze the bottle and gently irrigate the nose.  If you bend forward as you do this, most of the fluid will flow back out of the nose, instead of down your throat.   The solution should be warm, near body temperature, when you irrigate.   Each time you irrigate, you should use a full squeeze bottle.   Note that if you are instructed to use Nasal Steroid Sprays at any time after your surgery, irrigate with saline BEFORE using the steroid spray, so you do not wash it all out of the nose. Another product, Nasal Saline Gel (such as AYR Nasal Saline Gel) can be applied in each nostril 3 - 4 times daily to moisture the nose and reduce scabbing or crusting.  Bleeding:   Bloody drainage from the nose can be expected for several days, and patients are instructed to irrigate their nose frequently with salt water to help remove mucous and blood clots.  The drainage may be dark red or brown, though some fresh blood may be seen intermittently, especially after irrigation.  Do not blow you nose, as bleeding may occur. If you must sneeze, keep your mouth open to allow air to escape through your mouth.  If heavy bleeding occurs: Irrigate the nose with saline to rinse out clots, then spray the nose 3 - 4 times with Afrin Nasal Decongestant Spray.  The spray will constrict the blood vessels to slow bleeding.  Pinch the lower half of your nose shut to apply pressure, and lay down with your head elevated.  Ice packs over the nose may help as well. If bleeding persists despite these measures, you should notify your doctor.  Do not use the Afrin routinely to control nasal congestion after surgery, as it can result in worsening congestion and may affect healing.     Activity: Return to work varies among patients. Most patients will be   out of work at least 5 - 7 days to recover.  Patient may return to work after they are off of narcotic pain medication, and feeling well enough to perform the functions of their job.  Patients must avoid heavy lifting (over 10 pounds) or strenuous physical for 2 weeks after surgery, so your employer may need to assign you to light duty, or keep you out of work longer if light duty is not possible.  NOTE: you should not drive, operate dangerous machinery, do any mentally demanding tasks or make any important legal or financial decisions while on narcotic pain medication and recovering from the general anesthetic.    Call Your Doctor Immediately if You Have Any of the Following: Bleeding that you cannot control with the above measures Loss of vision, double vision, bulging of the eye or black eyes. Fever over 101 degrees Neck stiffness with severe headache,  fever, nausea and change in mental state. You are always encourage to call anytime with concerns, however, please call with requests for pain medication refills during office hours.  Office Endoscopy: During follow-up visits your doctor will remove any packing or splints that may have been placed and evaluate and clean your sinuses endoscopically.  Topical anesthetic will be used to make this as comfortable as possible, though you may want to take your pain medication prior to the visit.  How often this will need to be done varies from patient to patient.  After complete recovery from the surgery, you may need follow-up endoscopy from time to time, particularly if there is concern of recurrent infection or nasal polyps.   

## 2021-08-06 ENCOUNTER — Ambulatory Visit
Admission: RE | Admit: 2021-08-06 | Discharge: 2021-08-06 | Disposition: A | Discharge status: Home / Self Care | Payer: 59 | Attending: Unknown Physician Specialty | Admitting: Unknown Physician Specialty

## 2021-08-06 ENCOUNTER — Encounter: Payer: Self-pay | Admitting: Unknown Physician Specialty

## 2021-08-06 ENCOUNTER — Ambulatory Visit: Payer: 59 | Admitting: Anesthesiology

## 2021-08-06 ENCOUNTER — Other Ambulatory Visit: Payer: Self-pay

## 2021-08-06 ENCOUNTER — Encounter
Admission: RE | Disposition: A | Discharge status: Home / Self Care | Payer: Self-pay | Source: Home / Self Care | Attending: Unknown Physician Specialty

## 2021-08-06 DIAGNOSIS — Z87891 Personal history of nicotine dependence: Secondary | ICD-10-CM | POA: Diagnosis not present

## 2021-08-06 DIAGNOSIS — J343 Hypertrophy of nasal turbinates: Secondary | ICD-10-CM | POA: Insufficient documentation

## 2021-08-06 DIAGNOSIS — J342 Deviated nasal septum: Secondary | ICD-10-CM | POA: Insufficient documentation

## 2021-08-06 DIAGNOSIS — J3489 Other specified disorders of nose and nasal sinuses: Secondary | ICD-10-CM | POA: Diagnosis present

## 2021-08-06 DIAGNOSIS — J329 Chronic sinusitis, unspecified: Secondary | ICD-10-CM | POA: Insufficient documentation

## 2021-08-06 HISTORY — PX: SPHENOIDECTOMY: SHX2421

## 2021-08-06 HISTORY — PX: IMAGE GUIDED SINUS SURGERY: SHX6570

## 2021-08-06 HISTORY — PX: ETHMOIDECTOMY: SHX5197

## 2021-08-06 HISTORY — PX: MAXILLARY ANTROSTOMY: SHX2003

## 2021-08-06 HISTORY — PX: ENDOSCOPIC CONCHA BULLOSA RESECTION: SHX6395

## 2021-08-06 HISTORY — PX: NASAL SEPTOPLASTY W/ TURBINOPLASTY: SHX2070

## 2021-08-06 SURGERY — SINUS SURGERY, WITH IMAGING GUIDANCE
Anesthesia: General | Site: Nose | Laterality: Right

## 2021-08-06 MED ORDER — LIDOCAINE HCL (CARDIAC) PF 100 MG/5ML IV SOSY
PREFILLED_SYRINGE | INTRAVENOUS | Status: DC | PRN
  Administered 2021-08-06: 50 mg via INTRAVENOUS

## 2021-08-06 MED ORDER — OXYMETAZOLINE HCL 0.05 % NA SOLN
6.0000 | Freq: Once | NASAL | Status: AC
  Administered 2021-08-06: 6 via NASAL

## 2021-08-06 MED ORDER — PHENYLEPHRINE HCL 0.5 % NA SOLN
NASAL | Status: DC | PRN
  Administered 2021-08-06: 15 mL via TOPICAL

## 2021-08-06 MED ORDER — ACETAMINOPHEN 10 MG/ML IV SOLN
1000.0000 mg | Freq: Once | INTRAVENOUS | Status: AC
  Administered 2021-08-06: 1000 mg via INTRAVENOUS

## 2021-08-06 MED ORDER — DEXAMETHASONE SODIUM PHOSPHATE 4 MG/ML IJ SOLN
INTRAMUSCULAR | Status: DC | PRN
Start: 2021-08-06 — End: 2021-08-06
  Administered 2021-08-06: 10 mg via INTRAVENOUS

## 2021-08-06 MED ORDER — FENTANYL CITRATE (PF) 100 MCG/2ML IJ SOLN
INTRAMUSCULAR | Status: DC | PRN
  Administered 2021-08-06: 50 ug via INTRAVENOUS
  Administered 2021-08-06: 100 ug via INTRAVENOUS
  Administered 2021-08-06: 50 ug via INTRAVENOUS

## 2021-08-06 MED ORDER — DEXMEDETOMIDINE (PRECEDEX) IN NS 20 MCG/5ML (4 MCG/ML) IV SYRINGE
PREFILLED_SYRINGE | INTRAVENOUS | Status: DC | PRN
  Administered 2021-08-06: 20 ug via INTRAVENOUS
  Administered 2021-08-06: 10 ug via INTRAVENOUS

## 2021-08-06 MED ORDER — OXYCODONE HCL 5 MG/5ML PO SOLN
5.0000 mg | Freq: Once | ORAL | Status: AC | PRN
  Administered 2021-08-06: 5 mg via ORAL

## 2021-08-06 MED ORDER — ONDANSETRON HCL 4 MG/2ML IJ SOLN
INTRAMUSCULAR | Status: DC | PRN
  Administered 2021-08-06: 4 mg via INTRAVENOUS

## 2021-08-06 MED ORDER — OXYCODONE HCL 5 MG PO TABS: 5.0000 mg | ORAL_TABLET | Freq: Once | ORAL | Status: AC | PRN

## 2021-08-06 MED ORDER — LIDOCAINE-EPINEPHRINE 1 %-1:100000 IJ SOLN
INTRAMUSCULAR | Status: DC | PRN
  Administered 2021-08-06: 13 mL

## 2021-08-06 MED ORDER — LACTATED RINGERS IV SOLN: INTRAVENOUS | Status: DC

## 2021-08-06 MED ORDER — GLYCOPYRROLATE 0.2 MG/ML IJ SOLN
INTRAMUSCULAR | Status: DC | PRN
  Administered 2021-08-06: .1 mg via INTRAVENOUS

## 2021-08-06 MED ORDER — SUCCINYLCHOLINE CHLORIDE 200 MG/10ML IV SOSY
PREFILLED_SYRINGE | INTRAVENOUS | Status: DC | PRN
  Administered 2021-08-06: 100 mg via INTRAVENOUS

## 2021-08-06 MED ORDER — ACETAMINOPHEN 10 MG/ML IV SOLN: 1000.0000 mg | Freq: Once | INTRAVENOUS | Status: DC

## 2021-08-06 MED ORDER — SULFAMETHOXAZOLE-TRIMETHOPRIM 800-160 MG PO TABS: 1.0000 | ORAL_TABLET | Freq: Two times a day (BID) | ORAL | 0 refills | Status: DC

## 2021-08-06 MED ORDER — MIDAZOLAM HCL 5 MG/5ML IJ SOLN
INTRAMUSCULAR | Status: DC | PRN
  Administered 2021-08-06: 2 mg via INTRAVENOUS

## 2021-08-06 MED ORDER — FENTANYL CITRATE PF 50 MCG/ML IJ SOSY: 25.0000 ug | PREFILLED_SYRINGE | INTRAMUSCULAR | Status: DC | PRN

## 2021-08-06 MED ORDER — PROPOFOL 10 MG/ML IV BOLUS
INTRAVENOUS | Status: DC | PRN
  Administered 2021-08-06: 200 mg via INTRAVENOUS

## 2021-08-06 MED ORDER — OXYCODONE-ACETAMINOPHEN 7.5-325 MG PO TABS: 1.0000 | ORAL_TABLET | ORAL | 0 refills | Status: DC | PRN

## 2021-08-06 SURGICAL SUPPLY — 31 items
BATTERY INSTRU NAVIGATION (MISCELLANEOUS) ×12 IMPLANT
CANISTER SUCT 1200ML W/VALVE (MISCELLANEOUS) ×4 IMPLANT
COAG SUCT 10F 3.5MM HAND CTRL (MISCELLANEOUS) ×4 IMPLANT
CUP MEDICINE 2OZ PLAST GRAD ST (MISCELLANEOUS) ×4 IMPLANT
DRAPE HEAD BAR (DRAPES) ×4 IMPLANT
DRESSING NASL FOAM PST OP SINU (MISCELLANEOUS) ×6 IMPLANT
DRSG NASAL FOAM POST OP SINU (MISCELLANEOUS) ×8
ELECT REM PT RETURN 9FT ADLT (ELECTROSURGICAL) ×4
ELECTRODE REM PT RTRN 9FT ADLT (ELECTROSURGICAL) ×3 IMPLANT
GLOVE SURG ENC MOIS LTX SZ7.5 (GLOVE) ×8 IMPLANT
HANDLE YANKAUER SUCT BULB TIP (MISCELLANEOUS) ×4 IMPLANT
KIT TURNOVER KIT A (KITS) ×4 IMPLANT
NDL HYPO 25GX1X1/2 BEV (NEEDLE) ×3 IMPLANT
NEEDLE HYPO 25GX1X1/2 BEV (NEEDLE) ×4 IMPLANT
NS IRRIG 500ML POUR BTL (IV SOLUTION) ×4 IMPLANT
PACK ENT CUSTOM (PACKS) ×4 IMPLANT
SOL ANTI-FOG 6CC FOG-OUT (MISCELLANEOUS) ×3 IMPLANT
SOL FOG-OUT ANTI-FOG 6CC (MISCELLANEOUS) ×1
SPLINT NASAL SEPTAL BLV .25 LG (MISCELLANEOUS) ×1 IMPLANT
SPONGE NEURO XRAY DETECT 1X3 (DISPOSABLE) ×4 IMPLANT
STRAP BODY AND KNEE 60X3 (MISCELLANEOUS) ×4 IMPLANT
SUT CHROMIC 3-0 (SUTURE) ×4
SUT CHROMIC 3-0 KS 27XMFL CR (SUTURE) ×3
SUT ETHILON 3-0 KS 30 BLK (SUTURE) ×4 IMPLANT
SUTURE CHRMC 3-0 KS 27XMFL CR (SUTURE) ×3 IMPLANT
SYR 10ML LL (SYRINGE) ×4 IMPLANT
TOWEL OR 17X26 4PK STRL BLUE (TOWEL DISPOSABLE) ×4 IMPLANT
TRACKER CRANIALMASK (MASK) ×4 IMPLANT
TRAP MUCOUS 40ML (MISCELLANEOUS) ×4 IMPLANT
TUBING CONNECTING 10 (TUBING) ×4 IMPLANT
WATER STERILE IRR 250ML POUR (IV SOLUTION) ×4 IMPLANT

## 2021-08-06 NOTE — Anesthesia Procedure Notes (Addendum)
Procedure Name: Intubation Date/Time: 08/06/2021 9:15 AM Performed by: Jimmy Picket, CRNA Pre-anesthesia Checklist: Patient identified, Emergency Drugs available, Suction available, Patient being monitored and Timeout performed Patient Re-evaluated:Patient Re-evaluated prior to induction Oxygen Delivery Method: Circle system utilized Preoxygenation: Pre-oxygenation with 100% oxygen Induction Type: IV induction Ventilation: Mask ventilation without difficulty Laryngoscope Size: Miller and 3 Grade View: Grade I Tube type: Oral Rae Tube size: 8.0 mm Number of attempts: 1 Placement Confirmation: ETT inserted through vocal cords under direct vision, positive ETCO2 and breath sounds checked- equal and bilateral Tube secured with: Tape Dental Injury: Teeth and Oropharynx as per pre-operative assessment

## 2021-08-06 NOTE — Anesthesia Preprocedure Evaluation (Signed)
Anesthesia Evaluation  Patient identified by MRN, date of birth, ID band Patient awake    Reviewed: Allergy & Precautions, H&P , NPO status , Patient's Chart, lab work & pertinent test results  History of Anesthesia Complications Negative for: history of anesthetic complications  Airway Mallampati: II  TM Distance: >3 FB Neck ROM: full    Dental no notable dental hx.    Pulmonary neg pulmonary ROS, former smoker,    Pulmonary exam normal        Cardiovascular Normal cardiovascular exam Rhythm:regular Rate:Normal     Neuro/Psych    GI/Hepatic negative GI ROS, Neg liver ROS,   Endo/Other  negative endocrine ROS  Renal/GU negative Renal ROS  negative genitourinary   Musculoskeletal   Abdominal   Peds  Hematology negative hematology ROS (+)   Anesthesia Other Findings   Reproductive/Obstetrics negative OB ROS                             Anesthesia Physical Anesthesia Plan  ASA: 2  Anesthesia Plan: General ETT   Post-op Pain Management:    Induction:   PONV Risk Score and Plan: 2 and Ondansetron, Dexamethasone and Treatment may vary due to age or medical condition  Airway Management Planned:   Additional Equipment:   Intra-op Plan:   Post-operative Plan:   Informed Consent: I have reviewed the patients History and Physical, chart, labs and discussed the procedure including the risks, benefits and alternatives for the proposed anesthesia with the patient or authorized representative who has indicated his/her understanding and acceptance.       Plan Discussed with:   Anesthesia Plan Comments:         Anesthesia Quick Evaluation

## 2021-08-06 NOTE — Anesthesia Postprocedure Evaluation (Signed)
Anesthesia Post Note  Patient: Mario Kim  Procedure(s) Performed: IMAGE GUIDED SINUS SURGERY (Nose) MAXILLARY ANTROSTOMY WITH TISSUE REMOVAL (Bilateral: Nose) TOTAL ETHMOIDECTOMY AND LEFT FRONTAL (Bilateral: Nose) SPHENOIDECTOMY WITH TISSUE REMOVAL (Right: Nose) NASAL SEPTOPLASTY WITH TURBINATE REDUCTION (Bilateral: Nose) ENDOSCOPIC CONCHA BULLOSA RESECTION (Right: Nose)     Patient location during evaluation: PACU Anesthesia Type: General Level of consciousness: awake and alert Pain management: pain level controlled Vital Signs Assessment: post-procedure vital signs reviewed and stable Respiratory status: spontaneous breathing Cardiovascular status: stable Anesthetic complications: no   No notable events documented.  Marvis Repress

## 2021-08-06 NOTE — Transfer of Care (Signed)
Immediate Anesthesia Transfer of Care Note  Patient: Mario Kim  Procedure(s) Performed: IMAGE GUIDED SINUS SURGERY (Nose) MAXILLARY ANTROSTOMY WITH TISSUE REMOVAL (Bilateral: Nose) TOTAL ETHMOIDECTOMY AND LEFT FRONTAL (Bilateral: Nose) SPHENOIDECTOMY WITH TISSUE REMOVAL (Right: Nose) NASAL SEPTOPLASTY WITH TURBINATE REDUCTION (Bilateral: Nose) ENDOSCOPIC CONCHA BULLOSA RESECTION (Right: Nose)  Patient Location: PACU  Anesthesia Type: General ETT  Level of Consciousness: awake, alert  and patient cooperative  Airway and Oxygen Therapy: Patient Spontanous Breathing and Patient connected to supplemental oxygen  Post-op Assessment: Post-op Vital signs reviewed, Patient's Cardiovascular Status Stable, Respiratory Function Stable, Patent Airway and No signs of Nausea or vomiting  Post-op Vital Signs: Reviewed and stable  Complications: No notable events documented.

## 2021-08-06 NOTE — Op Note (Signed)
PREOPERATIVE DIAGNOSIS:  Chronic nasal obstruction.  POSTOPERATIVE DIAGNOSIS:  Chronic nasal obstruction.  SURGEON:  Davina Poke, M.D.  NAME OF PROCEDURE: #1 use of Stryker navigation system #2 bilateral endoscopic maxillary antrostomy with removal of tissue #3 bilateral endoscopic anterior and posterior ethmoidectomy with removal of tissue #4 right sphenoidotomy with removal of tissue #5 nasal septoplasty #6 submucous resection of inferior turbinates.  #7 endoscopic submucous section of right middle turbinate  OPERATIVE FINDINGS:  Severe nasal septal deformity, hypertrophy of the inferior turbinates.   DESCRIPTION OF THE PROCEDURE:  Mario Kim was identified in the holding area and taken to the operating room and placed in the supine position.  After general endotracheal anesthesia was induced, the table was turned 45 degrees and the patient was placed in a semi-Fowler position.  The nose was then topically anesthetized with Lidocaine, cotton pledgets were placed within each nostril. After approximately 5 minutes, this was removed at which time a local anesthetic of 1% Lidocaine 1:100,000 units of Epinephrine was used to inject the inferior turbinates in the nasal septum. A total of 29ml ml was used. Examination of the nose showed a severe left nasal septal deformity and tremendous hypertrophied inferior turbinate.  Beginning on the right hand side a hemitransfixion incision was then created on the leading edge of the septum on the right.  A subperichondrial plane was elevated posteriorly on the left and taken back to the perpendicular plate of the ethmoid where subperiosteal plane was elevated posteriorly on the left. A large septal spur was identified on the left hand side impacting on the inferior turbinate.  An inferior rim of cartilage was removed anteriorly with care taken to leave an anterior strut to prevent nasal collapse. With this strut removed the perpendicular plate of the  ethmoid was separated from the quadrangular cartilage. The large septal spur was removed.  The septum was then replaced in the midline. Reinspection through each nostril showed excellent reduction of the septal deformity. A left posterior inferior fenestration was then created to allow hematoma drainage.  With the septoplasty completed, the operation proceeded with the endoscopic sinus portion.  Beginning on the left-hand side using the 0 degree endoscope and the Stryker navigation device the middle turbinate was gently medialized.  The uncinate process was identified and taken down using the caudal elevator.  The maxillary sinus was then entered and the straight side-biting forceps were used to open the maxillary sinus widely.  There was thickened polypoid mucosa which was removed.  The ethmoid bulla was then identified this was opened using the straight suction and the straight ethmoid forceps.  There was polypoid disease within the ethmoids anterior and posterior ethmoid air cells were then clear.  The sinus widely patent I did not open the frontal sinus prior to the surgery I reviewed the CT scan and felt the frontal sinus did not need to be approached.  This completed the right side was examined again the middle turbinate had a widened tip was obstructing the ostiomeatal region.  Therefore the curved sinus scissors were used to remove the anterior tip of the middle turbinate.  Gave excellent visualization to the ostiomeatal region.  Again the uncinate process was identified with visualization and the Stryker navigation tool.  The uncinate process was taken down using the caudal elevator.  Straight and side-biting forceps were then used to open the max antrostomy widely again there was thickened polypoid mucosa which was removed.  The ethmoid bulla on the right was identified  and opened using the straight suction and the ethmoid forceps.  Anterior and posterior ethmoid cells were open and clean there was  significant polypoid disease within the cells which was removed the anterior wall of the sphenoid sinus was identified and opened again there was thickened polypoid mucosa within the sphenoid sinus which was removed.  Both sinus cavities were reinspected there were several small bony fragments which were removed and several small polypoid mucosal areas which were cleaned but the sinuses were widely patent.  The suction cautery was then used to cauterize the anterior tip of the turbinate on the right to prevent bleeding.  With this completed the operation proceeded with submucous resection of the inferior turbinates. Beginning on the left-hand side, a 15 blade was used to incise along the inferior edge of the inferior turbinate. A superior laterally based flap was then elevated. The underlying conchal bone of mucosa was excised using Knight scissors. The flap was then laid back over the turbinate stump and cauterized using suction cautery. In a similar fashion the submucous resection was performed on the right.  With the submucous resection completed bilaterally and no active bleeding, the hemitransfixion incision was then closed using two interrupted 3-0 chromic sutures.  Plastic nasal septal splints were placed within each nostril and affixed to the septum using a 3-0 nylon suture. Stammberger was then used beneath each inferior turbinate for hemostasis.    The patient tolerated the procedure well, was returned to anesthesia, extubated in the operating room, and taken to the recovery room in stable condition.    CULTURES:  None.  SPECIMENS:  None.  ESTIMATED BLOOD LOSS:  25 cc.  Davina Poke  08/06/2021  10:20 AM

## 2021-08-06 NOTE — H&P (Signed)
The patient's history has been reviewed, patient examined, no change in status, stable for surgery.  Questions were answered to the patients satisfaction.  

## 2021-08-10 ENCOUNTER — Encounter: Payer: Self-pay | Admitting: Unknown Physician Specialty

## 2021-08-11 LAB — SURGICAL PATHOLOGY

## 2021-12-15 ENCOUNTER — Other Ambulatory Visit: Payer: Self-pay | Admitting: Family Medicine

## 2021-12-16 ENCOUNTER — Other Ambulatory Visit: Payer: Self-pay | Admitting: Family Medicine

## 2021-12-16 DIAGNOSIS — R1013 Epigastric pain: Secondary | ICD-10-CM

## 2021-12-22 ENCOUNTER — Ambulatory Visit: Admission: RE | Admit: 2021-12-22 | Payer: 59 | Source: Ambulatory Visit

## 2022-01-05 ENCOUNTER — Ambulatory Visit
Admission: RE | Admit: 2022-01-05 | Discharge: 2022-01-05 | Disposition: A | Discharge status: Home / Self Care | Payer: 59 | Source: Ambulatory Visit | Attending: Family Medicine | Admitting: Family Medicine

## 2022-01-05 ENCOUNTER — Other Ambulatory Visit: Payer: Self-pay

## 2022-01-05 DIAGNOSIS — R1013 Epigastric pain: Secondary | ICD-10-CM | POA: Insufficient documentation

## 2022-05-17 ENCOUNTER — Emergency Department (EMERGENCY_DEPARTMENT_HOSPITAL): Payer: 59 | Admitting: Anesthesiology

## 2022-05-17 ENCOUNTER — Emergency Department (HOSPITAL_COMMUNITY): Payer: 59 | Admitting: Anesthesiology

## 2022-05-17 ENCOUNTER — Emergency Department (HOSPITAL_COMMUNITY): Payer: 59

## 2022-05-17 ENCOUNTER — Other Ambulatory Visit: Payer: Self-pay

## 2022-05-17 ENCOUNTER — Encounter (HOSPITAL_COMMUNITY): Payer: Self-pay

## 2022-05-17 ENCOUNTER — Encounter (HOSPITAL_COMMUNITY)
Admission: EM | Disposition: A | Discharge status: Home / Self Care | Payer: Self-pay | Source: Home / Self Care | Attending: Emergency Medicine

## 2022-05-17 ENCOUNTER — Ambulatory Visit (HOSPITAL_COMMUNITY)
Admission: EM | Admit: 2022-05-17 | Discharge: 2022-05-17 | Disposition: A | Discharge status: Home / Self Care | Payer: 59 | Attending: Emergency Medicine | Admitting: Emergency Medicine

## 2022-05-17 DIAGNOSIS — Z87891 Personal history of nicotine dependence: Secondary | ICD-10-CM | POA: Insufficient documentation

## 2022-05-17 DIAGNOSIS — S8254XA Nondisplaced fracture of medial malleolus of right tibia, initial encounter for closed fracture: Secondary | ICD-10-CM | POA: Insufficient documentation

## 2022-05-17 DIAGNOSIS — Y92410 Unspecified street and highway as the place of occurrence of the external cause: Secondary | ICD-10-CM | POA: Diagnosis not present

## 2022-05-17 DIAGNOSIS — S82891A Other fracture of right lower leg, initial encounter for closed fracture: Secondary | ICD-10-CM

## 2022-05-17 DIAGNOSIS — S8254XB Nondisplaced fracture of medial malleolus of right tibia, initial encounter for open fracture type I or II: Secondary | ICD-10-CM

## 2022-05-17 HISTORY — PX: ORIF ANKLE FRACTURE: SHX5408

## 2022-05-17 HISTORY — PX: INCISION AND DRAINAGE OF WOUND: SHX1803

## 2022-05-17 LAB — COMPREHENSIVE METABOLIC PANEL
ALT: 29 U/L (ref 0–44)
AST: 24 U/L (ref 15–41)
Albumin: 4 g/dL (ref 3.5–5.0)
Alkaline Phosphatase: 36 U/L — ABNORMAL LOW (ref 38–126)
Anion gap: 10 (ref 5–15)
BUN: 7 mg/dL (ref 6–20)
CO2: 26 mmol/L (ref 22–32)
Calcium: 9.5 mg/dL (ref 8.9–10.3)
Chloride: 105 mmol/L (ref 98–111)
Creatinine, Ser: 1.35 mg/dL — ABNORMAL HIGH (ref 0.61–1.24)
GFR, Estimated: >60 mL/min (ref >60)
Glucose, Bld: 124 mg/dL — ABNORMAL HIGH (ref 70–99)
Potassium: 3.6 mmol/L (ref 3.5–5.1)
Sodium: 141 mmol/L (ref 135–145)
Total Bilirubin: 0.8 mg/dL (ref 0.3–1.2)
Total Protein: 6.2 g/dL — ABNORMAL LOW (ref 6.5–8.1)

## 2022-05-17 LAB — I-STAT CHEM 8, ED
BUN: 6 mg/dL (ref 6–20)
Calcium, Ion: 1.21 mmol/L (ref 1.15–1.40)
Chloride: 105 mmol/L (ref 98–111)
Creatinine, Ser: 0.7 mg/dL (ref 0.61–1.24)
Glucose, Bld: 99 mg/dL (ref 70–99)
HCT: 38 % — ABNORMAL LOW (ref 39.0–52.0)
Hemoglobin: 12.9 g/dL — ABNORMAL LOW (ref 13.0–17.0)
Potassium: 4.1 mmol/L (ref 3.5–5.1)
Sodium: 141 mmol/L (ref 135–145)
TCO2: 27 mmol/L (ref 22–32)

## 2022-05-17 LAB — PROTIME-INR
INR: 1.1 (ref 0.8–1.2)
Prothrombin Time: 13.6 seconds (ref 11.4–15.2)

## 2022-05-17 LAB — SAMPLE TO BLOOD BANK

## 2022-05-17 LAB — CBC
HCT: 49.6 % (ref 39.0–52.0)
Hemoglobin: 16.8 g/dL (ref 13.0–17.0)
MCH: 30.4 pg (ref 26.0–34.0)
MCHC: 33.9 g/dL (ref 30.0–36.0)
MCV: 89.9 fL (ref 80.0–100.0)
Platelets: 354 10*3/uL (ref 150–400)
RBC: 5.52 MIL/uL (ref 4.22–5.81)
RDW: 12.3 % (ref 11.5–15.5)
WBC: 11 10*3/uL — ABNORMAL HIGH (ref 4.0–10.5)
nRBC: 0 % (ref 0.0–0.2)

## 2022-05-17 LAB — ETHANOL: Alcohol, Ethyl (B): <10 mg/dL (ref <10)

## 2022-05-17 LAB — LACTIC ACID, PLASMA: Lactic Acid, Venous: 2.5 mmol/L (ref 0.5–1.9)

## 2022-05-17 SURGERY — IRRIGATION AND DEBRIDEMENT WOUND
Anesthesia: General | Site: Ankle | Laterality: Right

## 2022-05-17 MED ORDER — FENTANYL CITRATE (PF) 100 MCG/2ML IJ SOLN
INTRAMUSCULAR | Status: AC
  Filled 2022-05-17: qty 2

## 2022-05-17 MED ORDER — FENTANYL CITRATE (PF) 100 MCG/2ML IJ SOLN: 100.0000 ug | Freq: Once | INTRAMUSCULAR | Status: AC

## 2022-05-17 MED ORDER — DEXAMETHASONE SODIUM PHOSPHATE 10 MG/ML IJ SOLN
INTRAMUSCULAR | Status: DC | PRN
  Administered 2022-05-17: 2 mg
  Administered 2022-05-17: 3 mg

## 2022-05-17 MED ORDER — FENTANYL CITRATE (PF) 250 MCG/5ML IJ SOLN
INTRAMUSCULAR | Status: AC
  Filled 2022-05-17: qty 5

## 2022-05-17 MED ORDER — FENTANYL CITRATE (PF) 100 MCG/2ML IJ SOLN: 25.0000 ug | INTRAMUSCULAR | Status: DC | PRN

## 2022-05-17 MED ORDER — MIDAZOLAM HCL 2 MG/2ML IJ SOLN
INTRAMUSCULAR | Status: AC
  Filled 2022-05-17: qty 2

## 2022-05-17 MED ORDER — PROPOFOL 10 MG/ML IV BOLUS
INTRAVENOUS | Status: AC
  Filled 2022-05-17: qty 20

## 2022-05-17 MED ORDER — POVIDONE-IODINE 10 % EX SWAB
2.0000 "application " | Freq: Once | CUTANEOUS | Status: AC
  Administered 2022-05-17: 2 via TOPICAL

## 2022-05-17 MED ORDER — 0.9 % SODIUM CHLORIDE (POUR BTL) OPTIME
TOPICAL | Status: DC | PRN
  Administered 2022-05-17: 1000 mL

## 2022-05-17 MED ORDER — CEFAZOLIN IN SODIUM CHLORIDE 3-0.9 GM/100ML-% IV SOLN
3.0000 g | INTRAVENOUS | Status: AC
  Administered 2022-05-17: 3 g via INTRAVENOUS
  Filled 2022-05-17: qty 100

## 2022-05-17 MED ORDER — MIDAZOLAM HCL 2 MG/2ML IJ SOLN
INTRAMUSCULAR | Status: AC
  Administered 2022-05-17: 2 mg via INTRAVENOUS
  Filled 2022-05-17: qty 2

## 2022-05-17 MED ORDER — ROPIVACAINE HCL 5 MG/ML IJ SOLN
INTRAMUSCULAR | Status: DC | PRN
  Administered 2022-05-17: 15 mL via PERINEURAL
  Administered 2022-05-17: 25 mL via PERINEURAL

## 2022-05-17 MED ORDER — CHLORHEXIDINE GLUCONATE 0.12 % MT SOLN
OROMUCOSAL | Status: AC
  Administered 2022-05-17: 15 mL via OROMUCOSAL
  Filled 2022-05-17: qty 15

## 2022-05-17 MED ORDER — AMISULPRIDE (ANTIEMETIC) 5 MG/2ML IV SOLN: 10.0000 mg | Freq: Once | INTRAVENOUS | Status: DC | PRN

## 2022-05-17 MED ORDER — ACETAMINOPHEN 10 MG/ML IV SOLN
INTRAVENOUS | Status: AC
  Filled 2022-05-17: qty 100

## 2022-05-17 MED ORDER — DEXAMETHASONE SODIUM PHOSPHATE 10 MG/ML IJ SOLN
INTRAMUSCULAR | Status: DC | PRN
  Administered 2022-05-17: 10 mg via INTRAVENOUS

## 2022-05-17 MED ORDER — FENTANYL CITRATE (PF) 100 MCG/2ML IJ SOLN
INTRAMUSCULAR | Status: AC
  Administered 2022-05-17: 100 ug via INTRAVENOUS
  Filled 2022-05-17: qty 2

## 2022-05-17 MED ORDER — FENTANYL CITRATE (PF) 250 MCG/5ML IJ SOLN
INTRAMUSCULAR | Status: DC | PRN
  Administered 2022-05-17 (×3): 50 ug via INTRAVENOUS

## 2022-05-17 MED ORDER — MIDAZOLAM HCL 2 MG/2ML IJ SOLN: 2.0000 mg | Freq: Once | INTRAMUSCULAR | Status: AC

## 2022-05-17 MED ORDER — PHENYLEPHRINE 80 MCG/ML (10ML) SYRINGE FOR IV PUSH (FOR BLOOD PRESSURE SUPPORT)
PREFILLED_SYRINGE | INTRAVENOUS | Status: AC
  Filled 2022-05-17: qty 10

## 2022-05-17 MED ORDER — LIDOCAINE 2% (20 MG/ML) 5 ML SYRINGE
INTRAMUSCULAR | Status: DC | PRN
  Administered 2022-05-17: 100 mg via INTRAVENOUS

## 2022-05-17 MED ORDER — SODIUM CHLORIDE 0.9 % IR SOLN
Status: DC | PRN
  Administered 2022-05-17: 3000 mL

## 2022-05-17 MED ORDER — SULFAMETHOXAZOLE-TRIMETHOPRIM 800-160 MG PO TABS: 1.0000 | ORAL_TABLET | Freq: Two times a day (BID) | ORAL | 0 refills | Status: AC

## 2022-05-17 MED ORDER — PHENYLEPHRINE HCL-NACL 20-0.9 MG/250ML-% IV SOLN
INTRAVENOUS | Status: DC | PRN
  Administered 2022-05-17: 20 ug/min via INTRAVENOUS

## 2022-05-17 MED ORDER — CHLORHEXIDINE GLUCONATE 0.12 % MT SOLN: 15.0000 mL | Freq: Once | OROMUCOSAL | Status: AC

## 2022-05-17 MED ORDER — ACETAMINOPHEN 10 MG/ML IV SOLN
INTRAVENOUS | Status: DC | PRN
  Administered 2022-05-17: 1000 mg via INTRAVENOUS

## 2022-05-17 MED ORDER — CEFAZOLIN SODIUM-DEXTROSE 1-4 GM/50ML-% IV SOLN
1.0000 g | Freq: Once | INTRAVENOUS | Status: AC
  Administered 2022-05-17: 1 g via INTRAVENOUS

## 2022-05-17 MED ORDER — ORAL CARE MOUTH RINSE: 15.0000 mL | Freq: Once | OROMUCOSAL | Status: AC

## 2022-05-17 MED ORDER — ONDANSETRON HCL 4 MG/2ML IJ SOLN
INTRAMUSCULAR | Status: DC | PRN
  Administered 2022-05-17: 4 mg via INTRAVENOUS

## 2022-05-17 MED ORDER — PROMETHAZINE HCL 25 MG/ML IJ SOLN: 6.2500 mg | INTRAMUSCULAR | Status: DC | PRN

## 2022-05-17 MED ORDER — ONDANSETRON HCL 4 MG/2ML IJ SOLN: INTRAMUSCULAR | Status: DC | PRN

## 2022-05-17 MED ORDER — CHLORHEXIDINE GLUCONATE 4 % EX LIQD: 60.0000 mL | Freq: Once | CUTANEOUS | Status: DC

## 2022-05-17 MED ORDER — IOHEXOL 300 MG/ML  SOLN
100.0000 mL | Freq: Once | INTRAMUSCULAR | Status: AC | PRN
  Administered 2022-05-17: 100 mL via INTRAVENOUS

## 2022-05-17 MED ORDER — VANCOMYCIN HCL 1000 MG IV SOLR
INTRAVENOUS | Status: DC | PRN
  Administered 2022-05-17: 1000 mg via TOPICAL

## 2022-05-17 MED ORDER — OXYCODONE HCL 5 MG PO TABS: 5.0000 mg | ORAL_TABLET | ORAL | 0 refills | Status: AC | PRN

## 2022-05-17 MED ORDER — ASPIRIN 325 MG PO TABS: 325.0000 mg | ORAL_TABLET | Freq: Every day | ORAL | 0 refills | Status: AC

## 2022-05-17 MED ORDER — PROPOFOL 10 MG/ML IV BOLUS
INTRAVENOUS | Status: DC | PRN
  Administered 2022-05-17: 200 mg via INTRAVENOUS

## 2022-05-17 MED ORDER — VANCOMYCIN HCL 1000 MG IV SOLR
INTRAVENOUS | Status: AC
  Filled 2022-05-17: qty 20

## 2022-05-17 MED ORDER — LACTATED RINGERS IV SOLN: INTRAVENOUS | Status: DC

## 2022-05-17 SURGICAL SUPPLY — 73 items
ALCOHOL 70% 16 OZ (MISCELLANEOUS) ×3 IMPLANT
BAG COUNTER SPONGE SURGICOUNT (BAG) ×3 IMPLANT
BANDAGE ESMARK 6X9 LF (GAUZE/BANDAGES/DRESSINGS) IMPLANT
BIT DRILL 1.7 (BIT) ×3
BIT DRILL 1.7X (BIT) IMPLANT
BLADE SURG 15 STRL LF DISP TIS (BLADE) ×2 IMPLANT
BLADE SURG 15 STRL SS (BLADE) ×3
BNDG COHESIVE 4X5 TAN STRL (GAUZE/BANDAGES/DRESSINGS) IMPLANT
BNDG COHESIVE 6X5 TAN STRL LF (GAUZE/BANDAGES/DRESSINGS) IMPLANT
BNDG ELASTIC 6X10 VLCR STRL LF (GAUZE/BANDAGES/DRESSINGS) ×3 IMPLANT
BNDG ESMARK 6X9 LF (GAUZE/BANDAGES/DRESSINGS)
CANISTER SUCT 3000ML PPV (MISCELLANEOUS) ×2 IMPLANT
CHLORAPREP W/TINT 26 (MISCELLANEOUS) ×4 IMPLANT
COVER SURGICAL LIGHT HANDLE (MISCELLANEOUS) ×3 IMPLANT
CUFF TOURN SGL QUICK 34 (TOURNIQUET CUFF) ×3
CUFF TOURN SGL QUICK 42 (TOURNIQUET CUFF) IMPLANT
CUFF TRNQT CYL 34X4.125X (TOURNIQUET CUFF) ×2 IMPLANT
DRAPE C-ARM 42X120 X-RAY (DRAPES) ×1 IMPLANT
DRAPE OEC MINIVIEW 54X84 (DRAPES) ×2 IMPLANT
DRAPE U-SHAPE 47X51 STRL (DRAPES) ×3 IMPLANT
DRSG MEPITEL 4X7.2 (GAUZE/BANDAGES/DRESSINGS) ×2 IMPLANT
DRSG PAD ABDOMINAL 8X10 ST (GAUZE/BANDAGES/DRESSINGS) ×4 IMPLANT
DRSG XEROFORM 1X8 (GAUZE/BANDAGES/DRESSINGS) ×2 IMPLANT
ELECT REM PT RETURN 9FT ADLT (ELECTROSURGICAL) ×3
ELECTRODE REM PT RTRN 9FT ADLT (ELECTROSURGICAL) ×2 IMPLANT
GAUZE SPONGE 4X4 12PLY STRL (GAUZE/BANDAGES/DRESSINGS) ×1 IMPLANT
GAUZE SPONGE 4X4 12PLY STRL LF (GAUZE/BANDAGES/DRESSINGS) ×2 IMPLANT
GAUZE XEROFORM 5X9 LF (GAUZE/BANDAGES/DRESSINGS) ×1 IMPLANT
GLOVE BIOGEL M STRL SZ7.5 (GLOVE) ×3 IMPLANT
GLOVE BIOGEL PI IND STRL 8 (GLOVE) ×2 IMPLANT
GLOVE BIOGEL PI INDICATOR 8 (GLOVE) ×1
GLOVE SRG 8 PF TXTR STRL LF DI (GLOVE) ×2 IMPLANT
GLOVE SURG ENC TEXT LTX SZ7.5 (GLOVE) ×3 IMPLANT
GLOVE SURG UNDER POLY LF SZ8 (GLOVE) ×3
GOWN STRL REUS W/ TWL LRG LVL3 (GOWN DISPOSABLE) ×2 IMPLANT
GOWN STRL REUS W/ TWL XL LVL3 (GOWN DISPOSABLE) ×4 IMPLANT
GOWN STRL REUS W/TWL LRG LVL3 (GOWN DISPOSABLE) ×3
GOWN STRL REUS W/TWL XL LVL3 (GOWN DISPOSABLE) ×6
HANDPIECE INTERPULSE COAX TIP (DISPOSABLE) ×3
KIT BASIN OR (CUSTOM PROCEDURE TRAY) ×3 IMPLANT
KIT TURNOVER KIT B (KITS) ×3 IMPLANT
MANIFOLD NEPTUNE II (INSTRUMENTS) ×1 IMPLANT
NS IRRIG 1000ML POUR BTL (IV SOLUTION) ×3 IMPLANT
PACK ORTHO EXTREMITY (CUSTOM PROCEDURE TRAY) ×3 IMPLANT
PAD ARMBOARD 7.5X6 YLW CONV (MISCELLANEOUS) ×6 IMPLANT
PAD CAST 4YDX4 CTTN HI CHSV (CAST SUPPLIES) ×2 IMPLANT
PADDING CAST COTTON 4X4 STRL (CAST SUPPLIES) ×3
PADDING CAST SYNTHETIC 4 (CAST SUPPLIES) ×4
PADDING CAST SYNTHETIC 4X4 STR (CAST SUPPLIES) IMPLANT
PIN SNAP-OFF FT 1.9X40 (Pin) ×1 IMPLANT
PIN SNAP-OFF FT 2.4X40 (Pin) ×1 IMPLANT
PLATE TSHAPE 2 6H (Plate) ×1 IMPLANT
SCREW CORT 26X2.4XLOPRFL NS LF (Screw) IMPLANT
SCREW CORT LP 2.4X26 (Screw) ×9 IMPLANT
SCREW LOCK 2.4X12 (Screw) ×2 IMPLANT
SET HNDPC FAN SPRY TIP SCT (DISPOSABLE) IMPLANT
SOL PREP POV-IOD 4OZ 10% (MISCELLANEOUS) ×1 IMPLANT
SOL SCRUB PVP POV-IOD 4OZ 7.5% (MISCELLANEOUS) ×3
SOLUTION SCRB POV-IOD 4OZ 7.5% (MISCELLANEOUS) IMPLANT
SPLINT PLASTER CAST XFAST 5X30 (CAST SUPPLIES) IMPLANT
SPLINT PLASTER XFAST SET 5X30 (CAST SUPPLIES) ×1
SPONGE T-LAP 18X18 ~~LOC~~+RFID (SPONGE) ×3 IMPLANT
SUCTION FRAZIER HANDLE 10FR (MISCELLANEOUS) ×3
SUCTION TUBE FRAZIER 10FR DISP (MISCELLANEOUS) ×2 IMPLANT
SUT ETHILON 2 0 FS 18 (SUTURE) ×1 IMPLANT
SUT ETHILON 3 0 PS 1 (SUTURE) ×2 IMPLANT
SUT MNCRL AB 3-0 PS2 27 (SUTURE) ×2 IMPLANT
SUT VIC AB 2-0 CT1 27 (SUTURE)
SUT VIC AB 2-0 CT1 TAPERPNT 27 (SUTURE) ×4 IMPLANT
TOWEL GREEN STERILE (TOWEL DISPOSABLE) ×3 IMPLANT
TOWEL GREEN STERILE FF (TOWEL DISPOSABLE) ×3 IMPLANT
TUBE CONNECTING 12X1/4 (SUCTIONS) ×3 IMPLANT
WIRE CALIBR MEASUREMENT 1.1 (WIRE) ×1 IMPLANT

## 2022-05-17 NOTE — Progress Notes (Signed)
Orthopedic Tech Progress Note Patient Details:  Mario Kim 1981/08/24 [id number]  Level 2 trauma   Patient ID: Jiles Prows, male   DOB: 1981-06-07, 41 y.o.   MRN: [id number]  Donald Pore 05/17/2022, 2:39 PM

## 2022-05-17 NOTE — Anesthesia Procedure Notes (Signed)
Procedure Name: LMA Insertion Date/Time: 05/17/2022 5:45 PM  Performed by: Minerva Ends, CRNAPre-anesthesia Checklist: Patient identified, Emergency Drugs available, Suction available and Patient being monitored Patient Re-evaluated:Patient Re-evaluated prior to induction Oxygen Delivery Method: Circle system utilized Preoxygenation: Pre-oxygenation with 100% oxygen Induction Type: IV induction Ventilation: Mask ventilation without difficulty LMA: LMA inserted LMA Size: 5.0 Tube type: Oral Number of attempts: 1 Placement Confirmation: breath sounds checked- equal and bilateral and positive ETCO2 Tube secured with: Tape Dental Injury: Teeth and Oropharynx as per pre-operative assessment
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
## 2022-05-17 NOTE — Anesthesia Procedure Notes (Signed)
Anesthesia Regional Block: Popliteal block   Pre-Anesthetic Checklist: , timeout performed,  Correct Patient, Correct Site, Correct Laterality,  Correct Procedure, Correct Position, site marked,  Risks and benefits discussed,  Surgical consent,  Pre-op evaluation,  At surgeon's request and post-op pain management  Laterality: Right  Prep: chloraprep       Needles:  Injection technique: Single-shot  Needle Type: Echogenic Stimulator Needle          Additional Needles:   Procedures:,,,, ultrasound used (permanent image in chart),,    Narrative:  Start time: 05/17/2022 4:44 PM End time: 05/17/2022 4:54 PM Injection made incrementally with aspirations every 5 mL.  Performed by: Personally  Anesthesiologist: Duane Boston, MD  Additional Notes: A functioning IV was confirmed and monitors were applied.  Sterile prep and drape, hand hygiene and sterile gloves were used.  Negative aspiration and test dose prior to incremental administration of local anesthetic. The patient tolerated the procedure well.Ultrasound  guidance: relevant anatomy identified, needle position confirmed, local anesthetic spread visualized around nerve(s), vascular puncture avoided.  Image printed for medical record. ACB supplement

## 2022-05-17 NOTE — Progress Notes (Signed)
Orthopedic Tech Progress Note Patient Details:  Mario Kim 1981-02-07 [id number]  Ortho Devices Type of Ortho Device: Crutches Ortho Device/Splint Interventions: Ordered, Application, Adjustment   Post Interventions Patient Tolerated: Well Instructions Provided: Adjustment of device, Care of device, Poper ambulation with device  Mario Kim [name] 05/17/2022, 8:00 PM

## 2022-05-17 NOTE — Discharge Instructions (Signed)
DR. Shail Urbas FOOT [facility] ANKLE SURGERY POST-OP INSTRUCTIONS   Pain Management The numbing medicine and your leg will last around 18 hours, take a dose of your pain medicine as soon as you feel it wearing off to avoid rebound pain. Keep your foot elevated above heart level.  Make sure that your heel hangs free ('floats'). Take all prescribed medication as directed. If taking narcotic pain medication you may want to use an over-the-counter stool softener to avoid constipation. You may take over-the-counter NSAIDs (ibuprofen, naproxen, etc.) as well as over-the-counter acetaminophen as directed on the packaging as a supplement for your pain and may also use it to wean away from the prescription medication.  Activity Non-weightbearing Keep splint intact  First Postoperative Visit Your first postop visit will be at least 2 weeks after surgery.  This should be scheduled when you schedule surgery. If you do not have a postoperative visit scheduled please call [phone] to schedule an appointment. At the appointment your incision will be evaluated for suture removal, x-rays will be obtained if necessary.  General Instructions Swelling is very common after foot and ankle surgery.  It often takes 3 months for the foot and ankle to begin to feel comfortable.  Some amount of swelling will persist for 6-12 months. DO NOT change the dressing.  If there is a problem with the dressing (too tight, loose, gets wet, etc.) please contact Dr. Valor Quaintance's office. DO NOT get the dressing wet.  For showers you can use an over-the-counter cast cover or wrap a washcloth around the top of your dressing and then cover it with a plastic bag and tape it to your leg. DO NOT soak the incision (no tubs, pools, bath, etc.) until you have approval from Dr. Dietrich Samuelson.  Contact Dr. Adairs office or go to Emergency Room if: Temperature above 101 F. Increasing pain that is unresponsive to pain medication or elevation Excessive redness or  swelling in your foot Dressing problems - excessive bloody drainage, looseness or tightness, or if dressing gets wet Develop pain, swelling, warmth, or discoloration of your calf  
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
## 2022-05-17 NOTE — Anesthesia Preprocedure Evaluation (Addendum)
Anesthesia Evaluation  Patient identified by MRN, date of birth, ID band Patient awake    Reviewed: Allergy & Precautions, H&P , NPO status , Patient's Chart, lab work & pertinent test results  History of Anesthesia Complications Negative for: history of anesthetic complications  Airway Mallampati: III  TM Distance: >3 FB Neck ROM: Full    Dental no notable dental hx. (+) Dental Advisory Given   Pulmonary neg pulmonary ROS, former smoker,    Pulmonary exam normal        Cardiovascular Normal cardiovascular exam     Neuro/Psych    GI/Hepatic negative GI ROS, Neg liver ROS,   Endo/Other  negative endocrine ROS  Renal/GU negative Renal ROS  negative genitourinary   Musculoskeletal   Abdominal   Peds  Hematology negative hematology ROS (+)   Anesthesia Other Findings   Reproductive/Obstetrics negative OB ROS                            Anesthesia Physical  Anesthesia Plan  ASA: 2 and emergent  Anesthesia Plan: General   Post-op Pain Management: Celebrex PO (pre-op)*, Tylenol PO (pre-op)* and Regional block*   Induction: Intravenous  PONV Risk Score and Plan: 2 and Ondansetron, Dexamethasone and Treatment may vary due to age or medical condition  Airway Management Planned: Oral ETT  Additional Equipment:   Intra-op Plan:   Post-operative Plan: Extubation in OR  Informed Consent: I have reviewed the patients History and Physical, chart, labs and discussed the procedure including the risks, benefits and alternatives for the proposed anesthesia with the patient or authorized representative who has indicated his/her understanding and acceptance.     Dental advisory given  Plan Discussed with: Anesthesiologist  Anesthesia Plan Comments:        Anesthesia Quick Evaluation

## 2022-05-17 NOTE — Transfer of Care (Signed)
Immediate Anesthesia Transfer of Care Note  Patient: Mario Kim  Procedure(s) Performed: IRRIGATION AND DEBRIDEMENT RIGHT ANKLE (Right) OPEN REDUCTION INTERNAL FIXATION (ORIF) ANKLE FRACTURE (Right: Ankle)  Patient Location: PACU  Anesthesia Type:GA combined with regional for post-op pain  Level of Consciousness: awake  Airway & Oxygen Therapy: Patient Spontanous Breathing  Post-op Assessment: Report given to RN and Post -op Vital signs reviewed and stable  Post vital signs: Reviewed and stable  Last Vitals:  Vitals Value Taken Time  BP 124/54 05/17/22 1930  Temp    Pulse 80 05/17/22 1931  Resp 19 05/17/22 1931  SpO2 97 % 05/17/22 1931  Vitals shown include unvalidated device data.  Last Pain:  Vitals:   05/17/22 1700  TempSrc:   PainSc: 0-No pain         Complications: No notable events documented.

## 2022-05-17 NOTE — Anesthesia Postprocedure Evaluation (Signed)
Anesthesia Post Note  Patient: Mario Kim  Procedure(s) Performed: IRRIGATION AND DEBRIDEMENT RIGHT ANKLE (Right) OPEN REDUCTION INTERNAL FIXATION (ORIF) ANKLE FRACTURE (Right: Ankle)     Patient location during evaluation: PACU Anesthesia Type: General and Regional Level of consciousness: awake and alert Pain management: pain level controlled Vital Signs Assessment: post-procedure vital signs reviewed and stable Respiratory status: spontaneous breathing, nonlabored ventilation and respiratory function stable Cardiovascular status: blood pressure returned to baseline and stable Postop Assessment: no apparent nausea or vomiting Anesthetic complications: no   No notable events documented.  Last Vitals:  Vitals:   05/17/22 1930 05/17/22 1945  BP: (!) 124/54 113/71  Pulse: 85 91  Resp: 15 16  Temp: (!) 36.3 C   SpO2: 96% 92%    Last Pain:  Vitals:   05/17/22 1930  TempSrc:   PainSc: 0-No pain                 Cristopher Ciccarelli,W. EDMOND
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
## 2022-05-17 NOTE — Brief Op Note (Signed)
05/17/2022  7:04 PM  PATIENT:  Mario Kim  41 y.o. male  PRE-OPERATIVE DIAGNOSIS:  Right ankle fracture  POST-OPERATIVE DIAGNOSIS:  Right ankle fracture  PROCEDURE:  Procedure(s): IRRIGATION AND DEBRIDEMENT RIGHT ANKLE (Right) OPEN REDUCTION INTERNAL FIXATION (ORIF) ANKLE FRACTURE (Right) Complex wound exploration Complex wound closure 19 cm  SURGEON:  Surgeon(s) and Role:    Erle Crocker, MD - Primary  PHYSICIAN ASSISTANT: Jesse Martinique, PA-C  ASSISTANTS: none   ANESTHESIA:   regional and general  EBL:  25 mL   BLOOD ADMINISTERED:none  DRAINS: none   LOCAL MEDICATIONS USED:  NONE  SPECIMEN:  No Specimen  DISPOSITION OF SPECIMEN:  N/A  COUNTS:  YES  TOURNIQUET:   Total Tourniquet Time Documented: Thigh (Right) - 68 minutes Total: Thigh (Right) - 68 minutes   DICTATION: .Viviann Spare Dictation  PLAN OF CARE: Discharge to home after PACU  PATIENT DISPOSITION:  PACU - hemodynamically stable.   Delay start of Pharmacological VTE agent (>24hrs) due to surgical blood loss or risk of bleeding: yes
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
## 2022-05-19 ENCOUNTER — Encounter (HOSPITAL_COMMUNITY): Payer: Self-pay | Admitting: Orthopaedic Surgery

## 2022-11-29 ENCOUNTER — Ambulatory Visit
Admission: EM | Admit: 2022-11-29 | Discharge: 2022-11-29 | Disposition: A | Discharge status: Home / Self Care | Payer: 59 | Attending: Emergency Medicine | Admitting: Emergency Medicine

## 2022-11-29 DIAGNOSIS — R030 Elevated blood-pressure reading, without diagnosis of hypertension: Secondary | ICD-10-CM

## 2022-11-29 DIAGNOSIS — J0100 Acute maxillary sinusitis, unspecified: Secondary | ICD-10-CM | POA: Diagnosis not present

## 2022-11-29 MED ORDER — AMOXICILLIN 875 MG PO TABS: 875.0000 mg | ORAL_TABLET | Freq: Two times a day (BID) | ORAL | 0 refills | Status: AC

## 2022-11-29 NOTE — ED Provider Notes (Signed)
Mario Kim    CSN: [id number] Arrival date & time: 11/29/22  1417      History   Chief Complaint Chief Complaint  Patient presents with   Nasal Congestion    Entered by patient    HPI Mario Kim is a 41 y.o. male.  Patient presents with 4-5 day history of sinus pressure, congestion, ear pain, mild cough.  No fever, shortness of breath, vomiting, diarrhea, or other symptoms.  Treatment at home with ibuprofen.  He reports similar symptoms with previous sinus infections; history of sinus surgery in 2022.    The history is provided by the patient and medical records.    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   Past Surgical History:  Procedure Laterality Date   ENDOSCOPIC CONCHA BULLOSA RESECTION Right 08/06/2021   Procedure: ENDOSCOPIC CONCHA BULLOSA RESECTION;  Surgeon: Linus Salmons, MD;  Location: [facility];  Service: ENT;  Laterality: Right;   ETHMOIDECTOMY Bilateral 08/06/2021   Procedure: TOTAL ETHMOIDECTOMY AND LEFT FRONTAL;  Surgeon: Linus Salmons, MD;  Location: [facility];  Service: ENT;  Laterality: Bilateral;   IMAGE GUIDED SINUS SURGERY N/A 08/06/2021   Procedure: IMAGE GUIDED SINUS SURGERY;  Surgeon: Linus Salmons, MD;  Location: [facility];  Service: ENT;  Laterality: N/A;  need disk PLACED DISK ON OR CHARGE NURSE DESK 8-29-KP placed 2nd disk on OR charge nurse desk [phone]  kp   INCISION AND DRAINAGE OF WOUND Right 05/17/2022   Procedure: IRRIGATION AND DEBRIDEMENT RIGHT ANKLE;  Surgeon: Terance Hart, MD;  Location: [facility] OR;  Service: Orthopedics;  Laterality: Right;   LUMBAR DISC SURGERY     L4-5   MAXILLARY ANTROSTOMY Bilateral 08/06/2021   Procedure: MAXILLARY ANTROSTOMY WITH TISSUE REMOVAL;  Surgeon: Linus Salmons, MD;  Location: [facility];  Service: ENT;  Laterality: Bilateral;   NASAL SEPTOPLASTY W/  TURBINOPLASTY Bilateral 08/06/2021   Procedure: NASAL SEPTOPLASTY WITH TURBINATE REDUCTION;  Surgeon: Linus Salmons, MD;  Location: [facility];  Service: ENT;  Laterality: Bilateral;   ORIF ANKLE FRACTURE Right 05/17/2022   Procedure: OPEN REDUCTION INTERNAL FIXATION (ORIF) ANKLE FRACTURE;  Surgeon: Terance Hart, MD;  Location: [facility] OR;  Service: Orthopedics;  Laterality: Right;   SPHENOIDECTOMY Right 08/06/2021   Procedure: SPHENOIDECTOMY WITH TISSUE REMOVAL;  Surgeon: Linus Salmons, MD;  Location: [facility];  Service: ENT;  Laterality: Right;   STRABISMUS SURGERY Bilateral 04/28/2017   Procedure: REPAIR STRABISMUS BILATERAL;  Surgeon: Verne Carrow, MD;  Location: [facility];  Service: Ophthalmology;  Laterality: Bilateral;   STRABISMUS SURGERY Bilateral 07/14/2017   Procedure: BILATERAL STRABISMUS REPAIR;  Surgeon: Verne Carrow, MD;  Location: [facility];  Service: Ophthalmology;  Laterality: Bilateral;       Home Medications    Prior to Admission medications   Medication Sig Start Date End Date Taking? Authorizing Provider  amoxicillin (AMOXIL) 875 MG tablet Take 1 tablet (875 mg total) by mouth 2 (two) times daily for 10 days. 11/29/22 12/09/22 Yes Mickie Bail, NP  aspirin (BAYER ASPIRIN) 325 MG tablet Take 1 tablet (325 mg total) by mouth daily. 05/17/22 05/17/23  Terance Hart, MD  fluticasone (FLONASE) 50 MCG/ACT nasal spray Place 2 sprays into both nostrils daily as needed for allergies. 04/09/22   [provider]  omeprazole (PRILOSEC) 20 MG capsule Take 20 mg by mouth  daily. 05/06/22   [provider]    Family History Family History  Problem Relation Age of Onset   Asthma Mother    Diabetes Mother    Hyperlipidemia Brother    Hypertension Brother    Other Father        unknown medical history    Social History Social History   Tobacco Use   Smoking status: Every Day    Packs/day: 1.50     Years: 20.00    Total pack years: 30.00    Types: Cigarettes, E-cigarettes    Last attempt to quit: 10/2020    Years since quitting: 2.1   Smokeless tobacco: Never  Vaping Use   Vaping Use: Former   Quit date: 10/05/2020  Substance Use Topics   Alcohol use: Yes    Comment: rarely   Drug use: No     Allergies   Tomato   Review of Systems Review of Systems  Constitutional:  Negative for chills and fever.  HENT:  Positive for congestion, ear pain, postnasal drip, rhinorrhea and sinus pressure. Negative for sore throat.   Respiratory:  Positive for cough. Negative for shortness of breath.   Cardiovascular:  Negative for chest pain and palpitations.  Gastrointestinal:  Negative for diarrhea and vomiting.  Skin:  Negative for color change and rash.  All other systems reviewed and are negative.    Physical Exam Triage Vital Signs ED Triage Vitals  Enc Vitals Group     BP 11/29/22 1633 (!) 148/96     Pulse Rate 11/29/22 1633 100     Resp 11/29/22 1633 18     Temp 11/29/22 1633 98.8 F (37.1 C)     Temp src --      SpO2 11/29/22 1633 98 %     Weight 11/29/22 1631 290 lb (131.5 kg)     Height 11/29/22 1631 6' 3.5" (1.918 m)     Head Circumference --      Peak Flow --      Pain Score 11/29/22 1631 3     Pain Loc --      Pain Edu? --      Excl. in GC? --    No data found.  Updated Vital Signs BP (!) 141/96   Pulse 100   Temp 98.8 F (37.1 C)   Resp 18   Ht 6' 3.5" (1.918 m)   Wt 290 lb (131.5 kg)   SpO2 98%   BMI 35.77 kg/m   Visual Acuity Right Eye Distance:   Left Eye Distance:   Bilateral Distance:    Right Eye Near:   Left Eye Near:    Bilateral Near:     Physical Exam Vitals and nursing note reviewed.  Constitutional:      General: He is not in acute distress.    Appearance: He is well-developed. He is not ill-appearing.  HENT:     Right Ear: Tympanic membrane normal.     Left Ear: Tympanic membrane normal.     Nose: Congestion present.      Mouth/Throat:     Mouth: Mucous membranes are moist.     Pharynx: Oropharynx is clear.  Cardiovascular:     Rate and Rhythm: Normal rate and regular rhythm.     Heart sounds: Normal heart sounds.  Pulmonary:     Effort: Pulmonary effort is normal. No respiratory distress.     Breath sounds: Normal breath sounds.  Musculoskeletal:     Cervical back: Neck  supple.  Skin:    General: Skin is warm and dry.  Neurological:     Mental Status: He is alert.  Psychiatric:        Mood and Affect: Mood normal.        Behavior: Behavior normal.      UC Treatments / Results  Labs (all labs ordered are listed, but only abnormal results are displayed) Labs Reviewed - No data to display  EKG   Radiology No results found.  Procedures Procedures (including critical care time)  Medications Ordered in UC Medications - No data to display  Initial Impression / Assessment and Plan / UC Course  I have reviewed the triage vital signs and the nursing notes.  Pertinent labs & imaging results that were available during my care of the patient were reviewed by me and considered in my medical decision making (see chart for details).    Acute sinusitis, Elevated blood pressure reading.  Treating with amoxicillin.  Discussed symptomatic treatment including Tylenol or ibuprofen, rest, hydration.  Instructed patient to follow up with PCP or ENT if symptoms are not improving.  He agrees to plan of care.   Final Clinical Impressions(s) / UC Diagnoses   Final diagnoses:  Acute non-recurrent maxillary sinusitis  Elevated blood pressure reading     Discharge Instructions      Take the amoxicillin as directed.  Follow up with your primary care provider if your symptoms are not improving.    Your blood pressure is elevated today at 146/96.  Please have this rechecked by your primary care provider in 2-4 weeks.          ED Prescriptions     Medication Sig Dispense Auth. Provider    amoxicillin (AMOXIL) 875 MG tablet Take 1 tablet (875 mg total) by mouth 2 (two) times daily for 10 days. 20 tablet Mickie Bail, NP      PDMP not reviewed this encounter.   Mickie Bail, NP 11/29/22 1721

## 2022-11-29 NOTE — ED Triage Notes (Signed)
Patient to Urgent Care with complaints of nasal congestion x4 days. Also reports feeling like he has fluid inside his ears. Reports sinus pressure and pain.  Sinus surgery two years ago. Commonly gets some nasal congestion. 4 days ago nasal congestion worsened- report feels like his sinuses are clogged. Reports discolored mucus when he blows his nose  Denies any known fever.

## 2022-11-29 NOTE — Discharge Instructions (Addendum)
Take the amoxicillin as directed.  Follow up with your primary care provider if your symptoms are not improving.    Your blood pressure is elevated today at 146/96.  Please have this rechecked by your primary care provider in 2-4 weeks.

## 2024-06-28 ENCOUNTER — Telehealth: Payer: Self-pay

## 2024-07-01 ENCOUNTER — Inpatient Hospital Stay

## 2024-07-01 ENCOUNTER — Ambulatory Visit: Payer: Self-pay | Admitting: Hematology and Oncology

## 2024-07-01 ENCOUNTER — Inpatient Hospital Stay: Attending: Hematology and Oncology | Admitting: Hematology and Oncology

## 2024-07-01 ENCOUNTER — Encounter: Payer: Self-pay | Admitting: Hematology and Oncology

## 2024-07-01 VITALS — BP 123/78 | HR 80 | Temp 98.6°F | Resp 19 | Wt 298.5 lb

## 2024-07-01 DIAGNOSIS — Z83438 Family history of other disorder of lipoprotein metabolism and other lipidemia: Secondary | ICD-10-CM | POA: Insufficient documentation

## 2024-07-01 DIAGNOSIS — Z825 Family history of asthma and other chronic lower respiratory diseases: Secondary | ICD-10-CM | POA: Diagnosis not present

## 2024-07-01 DIAGNOSIS — Z833 Family history of diabetes mellitus: Secondary | ICD-10-CM | POA: Diagnosis not present

## 2024-07-01 DIAGNOSIS — Z8249 Family history of ischemic heart disease and other diseases of the circulatory system: Secondary | ICD-10-CM | POA: Diagnosis not present

## 2024-07-01 DIAGNOSIS — G4730 Sleep apnea, unspecified: Secondary | ICD-10-CM | POA: Diagnosis not present

## 2024-07-01 DIAGNOSIS — F17290 Nicotine dependence, other tobacco product, uncomplicated: Secondary | ICD-10-CM | POA: Diagnosis not present

## 2024-07-01 DIAGNOSIS — F17210 Nicotine dependence, cigarettes, uncomplicated: Secondary | ICD-10-CM | POA: Diagnosis not present

## 2024-07-01 DIAGNOSIS — D751 Secondary polycythemia: Secondary | ICD-10-CM

## 2024-07-01 DIAGNOSIS — R5383 Other fatigue: Secondary | ICD-10-CM | POA: Insufficient documentation

## 2024-07-01 DIAGNOSIS — Z79899 Other long term (current) drug therapy: Secondary | ICD-10-CM | POA: Diagnosis not present

## 2024-07-01 LAB — CMP (CANCER CENTER ONLY)
ALT: 38 U/L (ref 0–44)
AST: 32 U/L (ref 15–41)
Albumin: 4.6 g/dL (ref 3.5–5.0)
Alkaline Phosphatase: 42 U/L (ref 38–126)
Anion gap: 8 (ref 5–15)
BUN: 11 mg/dL (ref 6–20)
CO2: 29 mmol/L (ref 22–32)
Calcium: 9.6 mg/dL (ref 8.9–10.3)
Chloride: 103 mmol/L (ref 98–111)
Creatinine: 1.26 mg/dL — ABNORMAL HIGH (ref 0.61–1.24)
GFR, Estimated: >60 mL/min (ref >60)
Glucose, Bld: 125 mg/dL — ABNORMAL HIGH (ref 70–99)
Potassium: 3.8 mmol/L (ref 3.5–5.1)
Sodium: 140 mmol/L (ref 135–145)
Total Bilirubin: 0.8 mg/dL (ref 0.0–1.2)
Total Protein: 7.3 g/dL (ref 6.5–8.1)

## 2024-07-01 LAB — CBC WITH DIFFERENTIAL/PLATELET
Abs Immature Granulocytes: 0.04 K/uL (ref 0.00–0.07)
Basophils Absolute: 0.1 K/uL (ref 0.0–0.1)
Basophils Relative: 1 %
Eosinophils Absolute: 0.4 K/uL (ref 0.0–0.5)
Eosinophils Relative: 4 %
HCT: 61 % — ABNORMAL HIGH (ref 39.0–52.0)
Hemoglobin: 20.7 g/dL — ABNORMAL HIGH (ref 13.0–17.0)
Immature Granulocytes: 0 %
Lymphocytes Relative: 24 %
Lymphs Abs: 2.4 K/uL (ref 0.7–4.0)
MCH: 30.1 pg (ref 26.0–34.0)
MCHC: 33.9 g/dL (ref 30.0–36.0)
MCV: 88.7 fL (ref 80.0–100.0)
Monocytes Absolute: 0.6 K/uL (ref 0.1–1.0)
Monocytes Relative: 6 %
Neutro Abs: 6.4 K/uL (ref 1.7–7.7)
Neutrophils Relative %: 65 %
Platelets: 220 K/uL (ref 150–400)
RBC: 6.88 MIL/uL — ABNORMAL HIGH (ref 4.22–5.81)
RDW: 13.6 % (ref 11.5–15.5)
WBC: 10 K/uL (ref 4.0–10.5)
nRBC: 0 % (ref 0.0–0.2)

## 2024-07-01 NOTE — Progress Notes (Signed)
 [facility] CONSULT NOTE  Patient Care Team: [name], FNP as PCP - General (Family Medicine)  CHIEF COMPLAINTS/PURPOSE OF CONSULTATION:  Suspicion for PV  ASSESSMENT & PLAN:  Assessment and Plan Assessment & Plan ?Secondary polycythemia We discussed about the pathogenesis of polycythemia, primary vs secondary polycythemia. We discussed about inciting factors for secondary polycythemia. Elevated hemoglobin likely due to testosterone therapy and OSA.  Primary polycythemia less likely. Sleep apnea may contribute. - Order CBC, epo levels JAK2, CalR, and MPL codon mutation tests to rule out primary polycythemia. - Consider therapeutic phlebotomy if hematocrit >54 - Discuss testosterone therapy adjustment with primary care if secondary polycythemia confirmed. - Advise avoiding vigorous exercise for 48 hours post-phlebotomy and ensure hydration.  Sleep apnea Contributing factor to secondary polycythemia. CPAP therapy effective with confirmed compliance. - Continue CPAP therapy.  HISTORY OF PRESENTING ILLNESS:  Mario Kim 43 y.o. 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 Referred by Dr. Thersia Holt for evaluation of possible polycythemia vera due to history of elevated hemoglobin prior to testosterone therapy.  He has consistently high hemoglobin levels, noted even before starting testosterone therapy per the referral. His hemoglobin was recorded at 19.3 with similar readings two weeks apart. He has been on testosterone therapy for approximately six months, initially prescribed for fatigue and low testosterone levels. The current dose is 100 mg per week, which has remained  unchanged since the start of therapy.  He is also on Zepbound for weight loss, initiated due to sleep apnea. He uses a CPAP machine daily for over a year and reports improvement in symptoms. Since starting Zepbound, he has lost approximately 50 pounds, reducing his weight from nearly 340 pounds to 298 pounds, and he exercises six days a week.  No history of blood clots is reported. His medical history includes eye surgeries in 2018 and an ankle reconstruction surgery in June 2023, all of which he recovered from without complications. There is no family history of blood conditions.  No new health issues. He quit smoking a while ago but occasionally smokes when aggravated.   All other systems were reviewed with the patient and are negative.  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    SURGICAL HISTORY: Past Surgical History:  Procedure Laterality Date   ENDOSCOPIC CONCHA BULLOSA RESECTION Right 08/06/2021   Procedure: ENDOSCOPIC CONCHA BULLOSA RESECTION;  Surgeon: Herminio Miu, MD;  Location: [facility];  Service: ENT;  Laterality: Right;   ETHMOIDECTOMY Bilateral 08/06/2021   Procedure: TOTAL ETHMOIDECTOMY AND LEFT FRONTAL;  Surgeon: Herminio Miu, MD;  Location: [facility];  Service: ENT;  Laterality: Bilateral;   IMAGE GUIDED SINUS SURGERY N/A 08/06/2021   Procedure: IMAGE GUIDED SINUS SURGERY;  Surgeon: Herminio Miu, MD;  Location: [facility];  Service: ENT;  Laterality: N/A;  need disk PLACED DISK ON OR CHARGE NURSE DESK 8-29-KP placed 2nd disk on OR charge nurse desk 8-31  kp   INCISION AND DRAINAGE OF WOUND Right 05/17/2022   Procedure: IRRIGATION AND DEBRIDEMENT RIGHT ANKLE;  Surgeon: Elsa Lonni SAUNDERS, MD;  Location: [facility] OR;  Service: Orthopedics;  Laterality: Right;   LUMBAR DISC SURGERY  L4-5   MAXILLARY ANTROSTOMY Bilateral 08/06/2021   Procedure: MAXILLARY ANTROSTOMY WITH TISSUE REMOVAL;   Surgeon: Herminio Miu, MD;  Location: [facility];  Service: ENT;  Laterality: Bilateral;   NASAL SEPTOPLASTY W/ TURBINOPLASTY Bilateral 08/06/2021   Procedure: NASAL SEPTOPLASTY WITH TURBINATE REDUCTION;  Surgeon: Herminio Miu, MD;  Location: [facility];  Service: ENT;  Laterality: Bilateral;   ORIF ANKLE FRACTURE Right 05/17/2022   Procedure: OPEN REDUCTION INTERNAL FIXATION (ORIF) ANKLE FRACTURE;  Surgeon: Elsa Lonni SAUNDERS, MD;  Location: [facility] OR;  Service: Orthopedics;  Laterality: Right;   SPHENOIDECTOMY Right 08/06/2021   Procedure: SPHENOIDECTOMY WITH TISSUE REMOVAL;  Surgeon: Herminio Miu, MD;  Location: [facility];  Service: ENT;  Laterality: Right;   STRABISMUS SURGERY Bilateral 04/28/2017   Procedure: REPAIR STRABISMUS BILATERAL;  Surgeon: Neysa Fallow, MD;  Location: [facility];  Service: Ophthalmology;  Laterality: Bilateral;   STRABISMUS SURGERY Bilateral 07/14/2017   Procedure: BILATERAL STRABISMUS REPAIR;  Surgeon: Neysa Fallow, MD;  Location: [facility];  Service: Ophthalmology;  Laterality: Bilateral;    SOCIAL HISTORY: Social History   Socioeconomic History   Marital status: Married    Spouse name: Not on file   Number of children: Not on file   Years of education: Not on file   Highest education level: Not on file  Occupational History   Not on file  Tobacco Use   Smoking status: Every Day    Current packs/day: 0.00    Average packs/day: 1.5 packs/day for 20.0 years (30.0 ttl pk-yrs)    Types: Cigarettes, E-cigarettes    Start date: 10/2000    Last attempt to quit: 10/2020    Years since quitting: 3.7   Smokeless tobacco: Never  Vaping Use   Vaping status: Former   Quit date: 10/05/2020  Substance and Sexual Activity   Alcohol use: Yes    Comment: rarely   Drug use: No   Sexual activity: Yes    Birth control/protection: None  Other Topics Concern   Not on file  Social History Narrative    Not on file   Social Drivers of Health   Financial Resource Strain: Not on file  Food Insecurity: No Food Insecurity (07/01/2024)   Hunger Vital Sign    Worried About Running Out of Food in the Last Year: Never true    Ran Out of Food in the Last Year: Never true  Transportation Needs: No Transportation Needs (07/01/2024)   PRAPARE - Administrator, Civil Service (Medical): No    Lack of Transportation (Non-Medical): No  Physical Activity: Not on file  Stress: Not on file  Social Connections: Unknown (07/01/2024)   Social Connection and Isolation Panel    Frequency of Communication with Friends and Family: Three times a week    Frequency of Social Gatherings with Friends and Family: Twice a week    Attends Religious Services: Not on Marketing executive or Organizations: Not on file    Attends Banker Meetings: Not on file    Marital Status: Not on file  Intimate Partner Violence: Not At Risk (07/01/2024)   Humiliation, Afraid, Rape, and Kick questionnaire    Fear of Current or Ex-Partner: No    Emotionally Abused: No    Physically Abused: No    Sexually Abused: No    FAMILY HISTORY: Family History  Problem Relation Age of Onset   Asthma Mother    Diabetes Mother  Hyperlipidemia Brother    Hypertension Brother    Other Father        unknown medical history    ALLERGIES:  is allergic to tomato.  MEDICATIONS:  Current Outpatient Medications  Medication Sig Dispense Refill   fluticasone (FLONASE) 50 MCG/ACT nasal spray Place 2 sprays into both nostrils daily as needed for allergies.     omeprazole (PRILOSEC) 20 MG capsule Take 20 mg by mouth daily.     No current facility-administered medications for this visit.     PHYSICAL EXAMINATION: ECOG PERFORMANCE STATUS: 0 - Asymptomatic  Vitals:   07/01/24 1111  BP: 123/78  Pulse: 80  Resp: 19  Temp: 98.6 F (37 C)  SpO2: 97%   Filed Weights   07/01/24 1111  Weight: 298 lb 8 oz  (135.4 kg)    GENERAL:alert, no distress and comfortable SKIN: skin color, texture, turgor are normal, no rashes or significant lesions EYES: normal, conjunctiva are pink and non-injected, sclera clear OROPHARYNX:no exudate, no erythema and lips, buccal mucosa, and tongue normal  NECK: supple, thyroid normal size, non-tender, without nodularity LYMPH:  no palpable lymphadenopathy in the cervical, axillary LUNGS: clear to auscultation and percussion with normal breathing effort HEART: regular rate & rhythm and no murmurs and no lower extremity edema ABDOMEN:abdomen soft, non-tender and normal bowel sounds Musculoskeletal:no cyanosis of digits and no clubbing  PSYCH: alert & oriented x 3 with fluent speech NEURO: no focal motor/sensory deficits  LABORATORY DATA:  I have reviewed the data as listed Lab Results  Component Value Date   WBC 11.0 (H) 05/17/2022   HGB 16.8 05/17/2022   HCT 49.6 05/17/2022   MCV 89.9 05/17/2022   PLT 354 05/17/2022     Chemistry      Component Value Date/Time   NA 141 05/17/2022 1439   K 3.6 05/17/2022 1439   CL 105 05/17/2022 1439   CO2 26 05/17/2022 1439   BUN 7 05/17/2022 1439   CREATININE 1.35 (H) 05/17/2022 1439      Component Value Date/Time   CALCIUM 9.5 05/17/2022 1439   ALKPHOS 36 (L) 05/17/2022 1439   AST 24 05/17/2022 1439   ALT 29 05/17/2022 1439   BILITOT 0.8 05/17/2022 1439       RADIOGRAPHIC STUDIES: I have personally reviewed the radiological images as listed and agreed with the findings in the report. No results found.  All questions were answered. The patient knows to call the clinic with any problems, questions or concerns. I spent 45 minutes in the care of this patient including H and P, review of records, counseling and coordination of care.     Amber Stalls, MD 07/01/2024 11:20 AM

## 2024-07-01 NOTE — Addendum Note (Signed)
 Addended by: Tori Dattilio, NAGA VENKATA KALIPRAVEENA on: 07/01/2024 03:47 PM   Modules accepted: Orders

## 2024-07-02 ENCOUNTER — Telehealth: Payer: Self-pay | Admitting: Hematology and Oncology

## 2024-07-02 ENCOUNTER — Encounter: Payer: Self-pay | Admitting: Hematology and Oncology

## 2024-07-02 LAB — ERYTHROPOIETIN: Erythropoietin: 7.3 m[IU]/mL (ref 2.6–18.5)

## 2024-07-02 NOTE — Telephone Encounter (Addendum)
 Scheduling request sent per below request.  ----- Message from [facility] sent at 07/01/2024  3:48 PM EDT ----- Already spoke with pt, he needs therapeutic phlebotomy, no hyperviscosity symptoms reported. Pt aware, can we schedule this as possible. ----- Message ----- From: Interface, Lab In [place] Sent: 07/01/2024  12:09 PM EDT To: Amber Stalls, MD

## 2024-07-02 NOTE — Telephone Encounter (Signed)
 Spoke with patient confirming upcoming appointment

## 2024-07-05 ENCOUNTER — Other Ambulatory Visit

## 2024-07-05 ENCOUNTER — Inpatient Hospital Stay: Attending: Hematology and Oncology

## 2024-07-05 VITALS — BP 121/74 | HR 78 | Temp 98.7°F | Resp 16 | Ht 75.5 in | Wt 299.8 lb

## 2024-07-05 DIAGNOSIS — Z833 Family history of diabetes mellitus: Secondary | ICD-10-CM | POA: Insufficient documentation

## 2024-07-05 DIAGNOSIS — F17290 Nicotine dependence, other tobacco product, uncomplicated: Secondary | ICD-10-CM | POA: Insufficient documentation

## 2024-07-05 DIAGNOSIS — F17210 Nicotine dependence, cigarettes, uncomplicated: Secondary | ICD-10-CM | POA: Insufficient documentation

## 2024-07-05 DIAGNOSIS — Z825 Family history of asthma and other chronic lower respiratory diseases: Secondary | ICD-10-CM | POA: Diagnosis not present

## 2024-07-05 DIAGNOSIS — Z8249 Family history of ischemic heart disease and other diseases of the circulatory system: Secondary | ICD-10-CM | POA: Insufficient documentation

## 2024-07-05 DIAGNOSIS — Z83438 Family history of other disorder of lipoprotein metabolism and other lipidemia: Secondary | ICD-10-CM | POA: Insufficient documentation

## 2024-07-05 DIAGNOSIS — D751 Secondary polycythemia: Secondary | ICD-10-CM

## 2024-07-05 DIAGNOSIS — D45 Polycythemia vera: Secondary | ICD-10-CM | POA: Diagnosis present

## 2024-07-05 DIAGNOSIS — Z79899 Other long term (current) drug therapy: Secondary | ICD-10-CM | POA: Insufficient documentation

## 2024-07-05 NOTE — Patient Instructions (Signed)

## 2024-07-05 NOTE — Progress Notes (Signed)
 Mario Kim presents today for phlebotomy per MD orders. Phlebotomy procedure started at 1641 and ended at 1647. 16 G phlebotomy kit was utilized to the [facility].  506 grams removed. Patient observed for 30 minutes after procedure without any incident. Patient tolerated procedure well. Patient declined all offered snacks and beverages.  IV needle removed intact. VSS at discharge.  Patient ambulatory to lobby at discharge.

## 2024-07-09 LAB — NGS JAK2 E12-15/CALR/MPL: E12-15 %: 3.31 %

## 2024-07-22 ENCOUNTER — Inpatient Hospital Stay (HOSPITAL_BASED_OUTPATIENT_CLINIC_OR_DEPARTMENT_OTHER): Admitting: Hematology and Oncology

## 2024-07-22 ENCOUNTER — Inpatient Hospital Stay

## 2024-07-22 VITALS — BP 132/28 | HR 62 | Temp 98.2°F | Wt 292.9 lb

## 2024-07-22 DIAGNOSIS — D45 Polycythemia vera: Secondary | ICD-10-CM

## 2024-07-22 DIAGNOSIS — D751 Secondary polycythemia: Secondary | ICD-10-CM

## 2024-07-22 LAB — CBC WITH DIFFERENTIAL/PLATELET
Abs Immature Granulocytes: 0.03 K/uL (ref 0.00–0.07)
Basophils Absolute: 0.1 K/uL (ref 0.0–0.1)
Basophils Relative: 1 %
Eosinophils Absolute: 0.4 K/uL (ref 0.0–0.5)
Eosinophils Relative: 4 %
HCT: 55.4 % — ABNORMAL HIGH (ref 39.0–52.0)
Hemoglobin: 19.5 g/dL — ABNORMAL HIGH (ref 13.0–17.0)
Immature Granulocytes: 0 %
Lymphocytes Relative: 25 %
Lymphs Abs: 2.6 K/uL (ref 0.7–4.0)
MCH: 30.4 pg (ref 26.0–34.0)
MCHC: 35.2 g/dL (ref 30.0–36.0)
MCV: 86.4 fL (ref 80.0–100.0)
Monocytes Absolute: 0.7 K/uL (ref 0.1–1.0)
Monocytes Relative: 6 %
Neutro Abs: 6.9 K/uL (ref 1.7–7.7)
Neutrophils Relative %: 64 %
Platelets: 280 K/uL (ref 150–400)
RBC: 6.41 MIL/uL — ABNORMAL HIGH (ref 4.22–5.81)
RDW: 13.1 % (ref 11.5–15.5)
WBC: 10.7 K/uL — ABNORMAL HIGH (ref 4.0–10.5)
nRBC: 0 % (ref 0.0–0.2)

## 2024-07-22 MED ORDER — HYDROXYUREA 500 MG PO CAPS: 500.0000 mg | ORAL_CAPSULE | Freq: Every day | ORAL | 1 refills | Status: DC

## 2024-07-22 NOTE — Progress Notes (Signed)
 [facility] CONSULT NOTE  Patient Care Team: [name], FNP as PCP - General (Family Medicine)  CHIEF COMPLAINTS/PURPOSE OF CONSULTATION:  Suspicion for PV  ASSESSMENT & PLAN:  Assessment and Plan Assessment & Plan Polycythemia vera, JAK 2 mutation. We discussed about the pathogenesis of polycythemia, primary vs secondary polycythemia. We discussed about inciting factors for secondary polycythemia.  HISTORY OF PRESENTING ILLNESS:  Mario Kim 43 y.o. 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  Discussed the use of AI scribe software for clinical note transcription with the patient, who gave verbal consent to proceed.  History of Present Illness   All other systems were reviewed with the patient and are negative.  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    SURGICAL HISTORY: Past Surgical History:  Procedure Laterality Date   ENDOSCOPIC CONCHA BULLOSA RESECTION Right 08/06/2021   Procedure: ENDOSCOPIC CONCHA BULLOSA RESECTION;  Surgeon: Herminio Miu, MD;  Location: [facility];  Service: ENT;  Laterality: Right;   ETHMOIDECTOMY Bilateral 08/06/2021   Procedure: TOTAL ETHMOIDECTOMY AND LEFT FRONTAL;  Surgeon: Herminio Miu, MD;  Location: [facility];  Service: ENT;  Laterality: Bilateral;   IMAGE GUIDED SINUS SURGERY N/A 08/06/2021   Procedure: IMAGE GUIDED SINUS SURGERY;  Surgeon: Herminio Miu, MD;  Location: [facility];  Service: ENT;  Laterality: N/A;  need disk PLACED DISK ON OR CHARGE NURSE DESK 8-29-KP placed 2nd disk on OR charge nurse desk 8-31  kp   INCISION AND DRAINAGE OF WOUND Right 05/17/2022   Procedure: IRRIGATION AND DEBRIDEMENT RIGHT ANKLE;  Surgeon: Elsa Lonni SAUNDERS, MD;  Location: [facility] OR;  Service: Orthopedics;  Laterality: Right;   LUMBAR  DISC SURGERY     L4-5   MAXILLARY ANTROSTOMY Bilateral 08/06/2021   Procedure: MAXILLARY ANTROSTOMY WITH TISSUE REMOVAL;  Surgeon: Herminio Miu, MD;  Location: [facility];  Service: ENT;  Laterality: Bilateral;   NASAL SEPTOPLASTY W/ TURBINOPLASTY Bilateral 08/06/2021   Procedure: NASAL SEPTOPLASTY WITH TURBINATE REDUCTION;  Surgeon: Herminio Miu, MD;  Location: [facility];  Service: ENT;  Laterality: Bilateral;   ORIF ANKLE FRACTURE Right 05/17/2022   Procedure: OPEN REDUCTION INTERNAL FIXATION (ORIF) ANKLE FRACTURE;  Surgeon: Elsa Lonni SAUNDERS, MD;  Location: [facility] OR;  Service: Orthopedics;  Laterality: Right;   SPHENOIDECTOMY Right 08/06/2021   Procedure: SPHENOIDECTOMY WITH TISSUE REMOVAL;  Surgeon: Herminio Miu, MD;  Location: [facility];  Service: ENT;  Laterality: Right;   STRABISMUS SURGERY Bilateral 04/28/2017   Procedure: REPAIR STRABISMUS BILATERAL;  Surgeon: Neysa Fallow, MD;  Location: [facility];  Service: Ophthalmology;  Laterality: Bilateral;   STRABISMUS SURGERY Bilateral 07/14/2017   Procedure: BILATERAL STRABISMUS REPAIR;  Surgeon: Neysa Fallow, MD;  Location:  SURGERY CENTER;  Service: Ophthalmology;  Laterality: Bilateral;    SOCIAL HISTORY: Social History   Socioeconomic History   Marital status: Married    Spouse name: Not on file   Number of children: Not on file   Years of education: Not on file   Highest education level: Not on file  Occupational History   Not on file  Tobacco Use   Smoking status: Every Day    Current packs/day: 0.00    Average packs/day: 1.5 packs/day for 20.0 years (30.0 ttl pk-yrs)    Types: Cigarettes, E-cigarettes  Start date: 10/2000    Last attempt to quit: 10/2020    Years since quitting: 3.7   Smokeless tobacco: Never  Vaping Use   Vaping status: Former   Quit date: 10/05/2020  Substance and Sexual Activity   Alcohol use: Yes    Comment: rarely   Drug use: No    Sexual activity: Yes    Birth control/protection: None  Other Topics Concern   Not on file  Social History Narrative   Not on file   Social Drivers of Health   Financial Resource Strain: Not on file  Food Insecurity: No Food Insecurity (07/01/2024)   Hunger Vital Sign    Worried About Running Out of Food in the Last Year: Never true    Ran Out of Food in the Last Year: Never true  Transportation Needs: No Transportation Needs (07/01/2024)   PRAPARE - Administrator, Civil Service (Medical): No    Lack of Transportation (Non-Medical): No  Physical Activity: Not on file  Stress: Not on file  Social Connections: Unknown (07/01/2024)   Social Connection and Isolation Panel    Frequency of Communication with Friends and Family: Three times a week    Frequency of Social Gatherings with Friends and Family: Twice a week    Attends Religious Services: Not on Marketing executive or Organizations: Not on file    Attends Banker Meetings: Not on file    Marital Status: Not on file  Intimate Partner Violence: Not At Risk (07/01/2024)   Humiliation, Afraid, Rape, and Kick questionnaire    Fear of Current or Ex-Partner: No    Emotionally Abused: No    Physically Abused: No    Sexually Abused: No    FAMILY HISTORY: Family History  Problem Relation Age of Onset   Asthma Mother    Diabetes Mother    Hyperlipidemia Brother    Hypertension Brother    Other Father        unknown medical history    ALLERGIES:  is allergic to tomato.  MEDICATIONS:  Current Outpatient Medications  Medication Sig Dispense Refill   fluticasone (FLONASE) 50 MCG/ACT nasal spray Place 2 sprays into both nostrils daily as needed for allergies.     losartan (COZAAR) 50 MG tablet Take 50 mg by mouth daily.     omeprazole (PRILOSEC) 20 MG capsule Take 20 mg by mouth daily.     Respiratory Therapy Supplies (CARETOUCH 2 CPAP HOSE HANGER) MISC CPAP     testosterone cypionate  (DEPOTESTOTERONE CYPIONATE) 100 MG/ML injection Inject 100 mg into the muscle once a week.     ZEPBOUND 7.5 MG/0.5ML Pen Inject 7.5 mg into the skin once a week.     No current facility-administered medications for this visit.     PHYSICAL EXAMINATION: ECOG PERFORMANCE STATUS: 0 - Asymptomatic  Vitals:   07/22/24 1126  BP: (!) 132/28  Pulse: 62  Temp: 98.2 F (36.8 C)  SpO2: 98%   Filed Weights   07/22/24 1126  Weight: 292 lb 14.4 oz (132.9 kg)    GENERAL:alert, no distress and comfortable   LABORATORY DATA:  I have reviewed the data as listed Lab Results  Component Value Date   WBC 10.0 07/01/2024   HGB 20.7 (H) 07/01/2024   HCT 61.0 (H) 07/01/2024   MCV 88.7 07/01/2024   PLT 220 07/01/2024     Chemistry      Component Value Date/Time   NA  140 07/01/2024 1144   K 3.8 07/01/2024 1144   CL 103 07/01/2024 1144   CO2 29 07/01/2024 1144   BUN 11 07/01/2024 1144   CREATININE 1.26 (H) 07/01/2024 1144      Component Value Date/Time   CALCIUM 9.6 07/01/2024 1144   ALKPHOS 42 07/01/2024 1144   AST 32 07/01/2024 1144   ALT 38 07/01/2024 1144   BILITOT 0.8 07/01/2024 1144       RADIOGRAPHIC STUDIES: I have personally reviewed the radiological images as listed and agreed with the findings in the report. No results found.  All questions were answered. The patient knows to call the clinic with any problems, questions or concerns. I spent 30 minutes in the care of this patient including H and P, review of records, counseling and coordination of care.     Amber Stalls, MD 07/22/2024 12:01 PM

## 2024-07-22 NOTE — Progress Notes (Signed)
 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 Therapeutic phlebotomy performed, further phlebotomy planned to control blood counts and reduce thrombotic risk. - Prescribed hydroxyurea  500 mg daily, titrate based on blood counts to avoid pancytopenia. - Prescribed 81 mg aspirin  daily to reduce thrombotic risk. - Scheduled three phlebotomies: first two one week apart, third two weeks after second. - Ordered blood work in 2-3 weeks to adjust hydroxyurea  dose. - Sent hydroxyurea  prescription to [facility], [place], [address] [place].  Testosterone therapy monitoring Continued monitoring of testosterone therapy necessary due to potential contribution to polycythemia.  HISTORY OF PRESENTING ILLNESS:  JAXSIN BOTTOMLEY 43 y.o. 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  History of Present Illness  ERVEY FALLIN is a 43 year old male with polycythemia vera who presents for follow-up after therapeutic phlebotomy.  He was informed that he has polycythemia vera after blood work showed the presence of the JAK2 mutation. Approximately two weeks ago, he underwent a therapeutic phlebotomy without any complications. He is currently experiencing fatigue, which he attributes to his work schedule, having not had a day off in twelve days.  He has no history of blood clots. He is currently taking testosterone. No new symptoms aside from fatigue.  All other systems were reviewed with the patient and are negative.  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    SURGICAL HISTORY: Past Surgical History:  Procedure Laterality Date   ENDOSCOPIC CONCHA BULLOSA RESECTION Right 08/06/2021   Procedure: ENDOSCOPIC CONCHA BULLOSA RESECTION;  Surgeon: Herminio Miu, MD;  Location: [facility];  Service: ENT;  Laterality: Right;   ETHMOIDECTOMY Bilateral 08/06/2021   Procedure: TOTAL ETHMOIDECTOMY AND LEFT FRONTAL;  Surgeon: Herminio Miu, MD;  Location: [facility];  Service: ENT;  Laterality: Bilateral;   IMAGE GUIDED SINUS SURGERY N/A 08/06/2021   Procedure: IMAGE GUIDED SINUS SURGERY;  Surgeon: Herminio Miu, MD;  Location: [facility];  Service: ENT;  Laterality: N/A;  need disk PLACED DISK ON OR CHARGE NURSE DESK 8-29-KP placed 2nd disk on OR charge nurse desk 8-31  kp   INCISION AND DRAINAGE OF WOUND Right 05/17/2022   Procedure: IRRIGATION AND DEBRIDEMENT RIGHT ANKLE;  Surgeon: Elsa Lonni SAUNDERS, MD;  Location: [facility] OR;  Service: Orthopedics;  Laterality: Right;   LUMBAR DISC SURGERY     L4-5   MAXILLARY ANTROSTOMY Bilateral 08/06/2021   Procedure: MAXILLARY ANTROSTOMY WITH TISSUE REMOVAL;  Surgeon: Herminio Miu, MD;  Location: [facility];  Service: ENT;  Laterality: Bilateral;   NASAL SEPTOPLASTY W/ TURBINOPLASTY Bilateral 08/06/2021   Procedure: NASAL SEPTOPLASTY WITH TURBINATE REDUCTION;  Surgeon: Herminio Miu, MD;  Location: [facility];  Service: ENT;  Laterality: Bilateral;   ORIF ANKLE FRACTURE Right 05/17/2022   Procedure: OPEN REDUCTION INTERNAL FIXATION (ORIF) ANKLE FRACTURE;  Surgeon: Elsa Lonni SAUNDERS, MD;  Location: [facility] OR;  Service: Orthopedics;  Laterality: Right;   SPHENOIDECTOMY Right 08/06/2021   Procedure: SPHENOIDECTOMY WITH TISSUE REMOVAL;  Surgeon: Herminio Miu, MD;  Location: [facility];  Service: ENT;  Laterality: Right;  STRABISMUS SURGERY Bilateral 04/28/2017   Procedure: REPAIR STRABISMUS BILATERAL;  Surgeon: Neysa Fallow, MD;  Location:  [facility];  Service: Ophthalmology;  Laterality: Bilateral;   STRABISMUS SURGERY Bilateral 07/14/2017   Procedure: BILATERAL STRABISMUS REPAIR;  Surgeon: Neysa Fallow, MD;  Location:  SURGERY CENTER;  Service: Ophthalmology;  Laterality: Bilateral;    SOCIAL HISTORY: Social History   Socioeconomic History   Marital status: Married    Spouse name: Not on file   Number of children: Not on file   Years of education: Not on file   Highest education level: Not on file  Occupational History   Not on file  Tobacco Use   Smoking status: Every Day    Current packs/day: 0.00    Average packs/day: 1.5 packs/day for 20.0 years (30.0 ttl pk-yrs)    Types: Cigarettes, E-cigarettes    Start date: 10/2000    Last attempt to quit: 10/2020    Years since quitting: 3.7   Smokeless tobacco: Never  Vaping Use   Vaping status: Former   Quit date: 10/05/2020  Substance and Sexual Activity   Alcohol use: Yes    Comment: rarely   Drug use: No   Sexual activity: Yes    Birth control/protection: None  Other Topics Concern   Not on file  Social History Narrative   Not on file   Social Drivers of Health   Financial Resource Strain: Not on file  Food Insecurity: No Food Insecurity (07/01/2024)   Hunger Vital Sign    Worried About Running Out of Food in the Last Year: Never true    Ran Out of Food in the Last Year: Never true  Transportation Needs: No Transportation Needs (07/01/2024)   PRAPARE - Administrator, Civil Service (Medical): No    Lack of Transportation (Non-Medical): No  Physical Activity: Not on file  Stress: Not on file  Social Connections: Unknown (07/01/2024)   Social Connection and Isolation Panel    Frequency of Communication with Friends and Family: Three times a week    Frequency of Social Gatherings with Friends and Family: Twice a week    Attends Religious Services: Not on Marketing executive or Organizations: Not on file     Attends Banker Meetings: Not on file    Marital Status: Not on file  Intimate Partner Violence: Not At Risk (07/01/2024)   Humiliation, Afraid, Rape, and Kick questionnaire    Fear of Current or Ex-Partner: No    Emotionally Abused: No    Physically Abused: No    Sexually Abused: No    FAMILY HISTORY: Family History  Problem Relation Age of Onset   Asthma Mother    Diabetes Mother    Hyperlipidemia Brother    Hypertension Brother    Other Father        unknown medical history    ALLERGIES:  is allergic to tomato.  MEDICATIONS:  Current Outpatient Medications  Medication Sig Dispense Refill   fluticasone (FLONASE) 50 MCG/ACT nasal spray Place 2 sprays into both nostrils daily as needed for allergies.     losartan (COZAAR) 50 MG tablet Take 50 mg by mouth daily.     omeprazole (PRILOSEC) 20 MG capsule Take 20 mg by mouth daily.     Respiratory Therapy Supplies (CARETOUCH 2 CPAP HOSE HANGER) MISC CPAP     testosterone cypionate (DEPOTESTOTERONE CYPIONATE) 100 MG/ML injection Inject 100 mg into the muscle once  a week.     ZEPBOUND 7.5 MG/0.5ML Pen Inject 7.5 mg into the skin once a week.     No current facility-administered medications for this visit.     PHYSICAL EXAMINATION: ECOG PERFORMANCE STATUS: 0 - Asymptomatic  Vitals:   07/22/24 1126  BP: (!) 132/28  Pulse: 62  Temp: 98.2 F (36.8 C)  SpO2: 98%   Filed Weights   07/22/24 1126  Weight: 292 lb 14.4 oz (132.9 kg)    GENERAL:alert, no distress and comfortable   LABORATORY DATA:  I have reviewed the data as listed Lab Results  Component Value Date   WBC 10.0 07/01/2024   HGB 20.7 (H) 07/01/2024   HCT 61.0 (H) 07/01/2024   MCV 88.7 07/01/2024   PLT 220 07/01/2024     Chemistry      Component Value Date/Time   NA 140 07/01/2024 1144   K 3.8 07/01/2024 1144   CL 103 07/01/2024 1144   CO2 29 07/01/2024 1144   BUN 11 07/01/2024 1144   CREATININE 1.26 (H) 07/01/2024 1144       Component Value Date/Time   CALCIUM 9.6 07/01/2024 1144   ALKPHOS 42 07/01/2024 1144   AST 32 07/01/2024 1144   ALT 38 07/01/2024 1144   BILITOT 0.8 07/01/2024 1144       RADIOGRAPHIC STUDIES: I have personally reviewed the radiological images as listed and agreed with the findings in the report. No results found.  All questions were answered. The patient knows to call the clinic with any problems, questions or concerns. I spent 30 minutes in the care of this patient including H and P, review of records, counseling and coordination of care.     Amber Stalls, MD 07/22/2024 12:00 PM

## 2024-07-26 ENCOUNTER — Inpatient Hospital Stay

## 2024-07-26 VITALS — BP 127/94 | HR 80 | Temp 98.0°F | Resp 16

## 2024-07-26 DIAGNOSIS — D751 Secondary polycythemia: Secondary | ICD-10-CM

## 2024-07-26 DIAGNOSIS — D45 Polycythemia vera: Secondary | ICD-10-CM | POA: Diagnosis not present

## 2024-07-26 NOTE — Patient Instructions (Signed)

## 2024-07-26 NOTE — Progress Notes (Signed)
 Patient here for therapeutic phlebotomy- his hemoglobin was 19.5 on 07/22/24. A 16g blood kit was used in his L AC- started at 1100 and ended at 1105- offered to slow down, but patient was asymptomatic. 501g were removed per protocol. Pressure dressing applied. Patient observed for 15 minutes after procedure without any incident- patient declined the entire 30 minute observation. VSS. BP 138/83 (BP Location: Right Arm, Patient Position: Sitting)   Pulse 80   Temp 98.3 F (36.8 C) (Oral)   Resp 16   SpO2 98%   Food and drink offered. Patient tolerated procedure well. IV needle removed intact.

## 2024-08-02 ENCOUNTER — Inpatient Hospital Stay

## 2024-08-02 ENCOUNTER — Encounter

## 2024-08-02 DIAGNOSIS — D45 Polycythemia vera: Secondary | ICD-10-CM | POA: Diagnosis not present

## 2024-08-02 NOTE — Progress Notes (Signed)
 Mario Kim presents today for phlebotomy per MD orders. Phlebotomy procedure started at 1558 and ended at 1603; 125g removed, with 16G PhlebKit to L AC. Needle clotted.  Restarted with new 16G PhlebKit on R AC at 1408, ended at 1418 with 384g removed; total removed 509g. Patient observed for 10 minutes after procedure without any incident. Patient tolerated procedure well. IV needle removed intact.

## 2024-08-08 ENCOUNTER — Inpatient Hospital Stay: Admitting: Hematology and Oncology

## 2024-08-08 ENCOUNTER — Inpatient Hospital Stay: Attending: Hematology and Oncology

## 2024-08-08 VITALS — BP 128/72 | HR 63 | Temp 98.5°F | Resp 19 | Wt 283.7 lb

## 2024-08-08 DIAGNOSIS — Z7982 Long term (current) use of aspirin: Secondary | ICD-10-CM | POA: Insufficient documentation

## 2024-08-08 DIAGNOSIS — F17290 Nicotine dependence, other tobacco product, uncomplicated: Secondary | ICD-10-CM | POA: Insufficient documentation

## 2024-08-08 DIAGNOSIS — Z825 Family history of asthma and other chronic lower respiratory diseases: Secondary | ICD-10-CM | POA: Insufficient documentation

## 2024-08-08 DIAGNOSIS — D45 Polycythemia vera: Secondary | ICD-10-CM | POA: Insufficient documentation

## 2024-08-08 DIAGNOSIS — Z79899 Other long term (current) drug therapy: Secondary | ICD-10-CM | POA: Diagnosis not present

## 2024-08-08 DIAGNOSIS — Z833 Family history of diabetes mellitus: Secondary | ICD-10-CM | POA: Insufficient documentation

## 2024-08-08 DIAGNOSIS — F17210 Nicotine dependence, cigarettes, uncomplicated: Secondary | ICD-10-CM | POA: Insufficient documentation

## 2024-08-08 DIAGNOSIS — Z8249 Family history of ischemic heart disease and other diseases of the circulatory system: Secondary | ICD-10-CM | POA: Diagnosis not present

## 2024-08-08 DIAGNOSIS — D751 Secondary polycythemia: Secondary | ICD-10-CM

## 2024-08-08 DIAGNOSIS — Z83438 Family history of other disorder of lipoprotein metabolism and other lipidemia: Secondary | ICD-10-CM | POA: Diagnosis not present

## 2024-08-08 LAB — CBC WITH DIFFERENTIAL/PLATELET
Abs Immature Granulocytes: 0.01 K/uL (ref 0.00–0.07)
Basophils Absolute: 0.1 K/uL (ref 0.0–0.1)
Basophils Relative: 1 %
Eosinophils Absolute: 0.3 K/uL (ref 0.0–0.5)
Eosinophils Relative: 4 %
HCT: 52.7 % — ABNORMAL HIGH (ref 39.0–52.0)
Hemoglobin: 17.9 g/dL — ABNORMAL HIGH (ref 13.0–17.0)
Immature Granulocytes: 0 %
Lymphocytes Relative: 23 %
Lymphs Abs: 1.9 K/uL (ref 0.7–4.0)
MCH: 29.9 pg (ref 26.0–34.0)
MCHC: 34 g/dL (ref 30.0–36.0)
MCV: 88 fL (ref 80.0–100.0)
Monocytes Absolute: 0.5 K/uL (ref 0.1–1.0)
Monocytes Relative: 6 %
Neutro Abs: 5.4 K/uL (ref 1.7–7.7)
Neutrophils Relative %: 66 %
Platelets: 282 K/uL (ref 150–400)
RBC: 5.99 MIL/uL — ABNORMAL HIGH (ref 4.22–5.81)
RDW: 13.4 % (ref 11.5–15.5)
WBC: 8.2 K/uL (ref 4.0–10.5)
nRBC: 0 % (ref 0.0–0.2)

## 2024-08-08 MED ORDER — HYDROXYUREA 500 MG PO CAPS: 500.0000 mg | ORAL_CAPSULE | Freq: Two times a day (BID) | ORAL | 1 refills | Status: DC

## 2024-08-08 NOTE — Progress Notes (Signed)
 [facility] CONSULT NOTE  Patient Care Team: [name], FNP as PCP - General (Family Medicine)  CHIEF COMPLAINTS/PURPOSE OF CONSULTATION:  Suspicion for PV  ASSESSMENT & PLAN:  Assessment and Plan Assessment & Plan  Polycythemia vera Managed with Hydrea  and aspirin . Hemoglobin decreased to 17.9, further reduction needed. Hematocrit at 52, target under 45. No adverse effects from Hydrea . Insurance issues with phlebotomy costs noted. - Increase Hydrea  to one capsule in the morning and one in the evening. - Send renewed prescription for Hydrea  to [facility] in [place]. - Perform phlebotomy next Friday. - Monitor blood counts and adjust treatment as needed. - Plan to reduce phlebotomy frequency once Hydrea  dose is optimized. Continue baby aspirin  81 mg po daily.  Testosterone therapy management Ongoing weekly dosing. No blood clots reported. Emphasized not increasing dose without consultation to balance with polycythemia vera management.   HISTORY OF PRESENTING ILLNESS:  Mario Kim 43 y.o. 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  History of Present Illness   Mario Kim is a 43 year old male with polycythemia vera who presents for follow-up and medication adjustment.  He is currently taking Hydrea , one capsule daily, and tolerates it well. His hematocrit is 52. He also takes a baby aspirin  daily.  He had his last phlebotomy on the previous Friday and is scheduled for another next Friday. He mentions that his insurance charges $105 for each phlebotomy session.  He is on weekly testosterone therapy but has not taken his dose for the current week. He has never experienced a blood clot.  No swelling in his legs, no skin rash, and no mouth ulcers. He notes improved sleep over the last couple of weeks.  All other systems  were reviewed with the patient and are negative.  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    SURGICAL HISTORY: Past Surgical History:  Procedure Laterality Date   ENDOSCOPIC CONCHA BULLOSA RESECTION Right 08/06/2021   Procedure: ENDOSCOPIC CONCHA BULLOSA RESECTION;  Surgeon: Herminio Miu, MD;  Location: [facility];  Service: ENT;  Laterality: Right;   ETHMOIDECTOMY Bilateral 08/06/2021   Procedure: TOTAL ETHMOIDECTOMY AND LEFT FRONTAL;  Surgeon: Herminio Miu, MD;  Location: [facility];  Service: ENT;  Laterality: Bilateral;   IMAGE GUIDED SINUS SURGERY N/A 08/06/2021   Procedure: IMAGE GUIDED SINUS SURGERY;  Surgeon: Herminio Miu, MD;  Location: [facility];  Service: ENT;  Laterality: N/A;  need disk PLACED DISK ON OR CHARGE NURSE DESK 8-29-KP placed 2nd disk on OR charge nurse desk 8-31  kp   INCISION AND DRAINAGE OF WOUND Right 05/17/2022   Procedure: IRRIGATION AND DEBRIDEMENT RIGHT ANKLE;  Surgeon: Elsa Lonni SAUNDERS, MD;  Location: [facility] OR;  Service: Orthopedics;  Laterality: Right;   LUMBAR DISC SURGERY     L4-5   MAXILLARY ANTROSTOMY Bilateral 08/06/2021   Procedure: MAXILLARY ANTROSTOMY WITH TISSUE REMOVAL;  Surgeon: Herminio Miu, MD;  Location: [facility];  Service: ENT;  Laterality: Bilateral;   NASAL SEPTOPLASTY W/ TURBINOPLASTY Bilateral 08/06/2021   Procedure: NASAL SEPTOPLASTY WITH TURBINATE REDUCTION;  Surgeon: Herminio Miu, MD;  Location: [facility];  Service: ENT;  Laterality: Bilateral;   ORIF ANKLE FRACTURE Right 05/17/2022   Procedure: OPEN REDUCTION INTERNAL FIXATION (ORIF) ANKLE FRACTURE;  Surgeon: Elsa Lonni SAUNDERS, MD;  Location: [facility] OR;  Service: Orthopedics;  Laterality: Right;   SPHENOIDECTOMY Right 08/06/2021   Procedure: SPHENOIDECTOMY WITH TISSUE REMOVAL;  Surgeon: Herminio Miu, MD;  Location: [facility];  Service: ENT;  Laterality: Right;    STRABISMUS SURGERY Bilateral 04/28/2017   Procedure: REPAIR STRABISMUS BILATERAL;  Surgeon: Neysa Fallow, MD;  Location: [facility];  Service: Ophthalmology;  Laterality: Bilateral;   STRABISMUS SURGERY Bilateral 07/14/2017   Procedure: BILATERAL STRABISMUS REPAIR;  Surgeon: Neysa Fallow, MD;  Location: [place] [facility];  Service: Ophthalmology;  Laterality: Bilateral;    SOCIAL HISTORY: Social History   Socioeconomic History   Marital status: Married    Spouse name: Not on file   Number of children: Not on file   Years of education: Not on file   Highest education level: Not on file  Occupational History   Not on file  Tobacco Use   Smoking status: Every Day    Current packs/day: 0.00    Average packs/day: 1.5 packs/day for 20.0 years (30.0 ttl pk-yrs)    Types: Cigarettes, E-cigarettes    Start date: 10/2000    Last attempt to quit: 10/2020    Years since quitting: 3.8   Smokeless tobacco: Never  Vaping Use   Vaping status: Former   Quit date: 10/05/2020  Substance and Sexual Activity   Alcohol use: Yes    Comment: rarely   Drug use: No   Sexual activity: Yes    Birth control/protection: None  Other Topics Concern   Not on file  Social History Narrative   Not on file   Social Drivers of Health   Financial Resource Strain: Not on file  Food Insecurity: No Food Insecurity (07/01/2024)   Hunger Vital Sign    Worried About Running Out of Food in the Last Year: Never true    Ran Out of Food in the Last Year: Never true  Transportation Needs: No Transportation Needs (07/01/2024)   PRAPARE - Administrator, Civil Service (Medical): No    Lack of Transportation (Non-Medical): No  Physical Activity: Not on file  Stress: Not on file  Social Connections: Unknown (07/01/2024)   Social Connection and Isolation Panel    Frequency of Communication with Friends and Family: Three times a week    Frequency of Social Gatherings with Friends and  Family: Twice a week    Attends Religious Services: Not on Marketing executive or Organizations: Not on file    Attends Banker Meetings: Not on file    Marital Status: Not on file  Intimate Partner Violence: Not At Risk (07/01/2024)   Humiliation, Afraid, Rape, and Kick questionnaire    Fear of Current or Ex-Partner: No    Emotionally Abused: No    Physically Abused: No    Sexually Abused: No    FAMILY HISTORY: Family History  Problem Relation Age of Onset   Asthma Mother    Diabetes Mother    Hyperlipidemia Brother    Hypertension Brother    Other Father        unknown medical history    ALLERGIES:  is allergic to tomato.  MEDICATIONS:  Current Outpatient Medications  Medication Sig Dispense Refill   fluticasone (FLONASE) 50 MCG/ACT nasal spray Place 2 sprays into both nostrils daily as needed for allergies.     losartan (COZAAR) 50 MG tablet Take 50 mg by mouth daily.     omeprazole (PRILOSEC) 20 MG  capsule Take 20 mg by mouth daily.     Respiratory Therapy Supplies (CARETOUCH 2 CPAP HOSE HANGER) MISC CPAP     testosterone cypionate (DEPOTESTOTERONE CYPIONATE) 100 MG/ML injection Inject 100 mg into the muscle once a week.     ZEPBOUND 7.5 MG/0.5ML Pen Inject 7.5 mg into the skin once a week.     hydroxyurea  (HYDREA ) 500 MG capsule Take 1 capsule (500 mg total) by mouth 2 (two) times daily. May take with food to minimize GI side effects. 60 capsule 1   No current facility-administered medications for this visit.     PHYSICAL EXAMINATION: ECOG PERFORMANCE STATUS: 0 - Asymptomatic  Vitals:   08/08/24 1349  BP: 128/72  Pulse: 63  Resp: 19  Temp: 98.5 F (36.9 C)  SpO2: 99%   Filed Weights   08/08/24 1349  Weight: 283 lb 11.2 oz (128.7 kg)    GENERAL:alert, no distress and comfortable   LABORATORY DATA:  I have reviewed the data as listed Lab Results  Component Value Date   WBC 8.2 08/08/2024   HGB 17.9 (H) 08/08/2024   HCT 52.7  (H) 08/08/2024   MCV 88.0 08/08/2024   PLT 282 08/08/2024     Chemistry      Component Value Date/Time   NA 140 07/01/2024 1144   K 3.8 07/01/2024 1144   CL 103 07/01/2024 1144   CO2 29 07/01/2024 1144   BUN 11 07/01/2024 1144   CREATININE 1.26 (H) 07/01/2024 1144      Component Value Date/Time   CALCIUM 9.6 07/01/2024 1144   ALKPHOS 42 07/01/2024 1144   AST 32 07/01/2024 1144   ALT 38 07/01/2024 1144   BILITOT 0.8 07/01/2024 1144       RADIOGRAPHIC STUDIES: I have personally reviewed the radiological images as listed and agreed with the findings in the report. No results found.  All questions were answered. The patient knows to call the clinic with any problems, questions or concerns. I spent 20 minutes in the care of this patient including H and P, review of records, counseling and coordination of care.     Amber Stalls, MD 08/08/2024 2:08 PM

## 2024-08-16 ENCOUNTER — Inpatient Hospital Stay

## 2024-08-16 VITALS — BP 126/84 | HR 73 | Temp 98.4°F | Resp 17

## 2024-08-16 DIAGNOSIS — D45 Polycythemia vera: Secondary | ICD-10-CM | POA: Diagnosis not present

## 2024-08-16 DIAGNOSIS — D751 Secondary polycythemia: Secondary | ICD-10-CM

## 2024-08-16 NOTE — Progress Notes (Signed)
 Mario Kim presents today for phlebotomy per MD orders. 16 G phlebotomy kit used in the L AC.  Phlebotomy procedure started at 1322 and ended at 1330. 508 grams removed. Pt completed procedure without any incident. IV needle removed intact. Patient declined 30 minute observation after procedure as well as any food or drink.   Patient tolerated procedure well. Vital signs stable at discharge. Ambulatory to lobby.

## 2024-08-16 NOTE — Patient Instructions (Signed)

## 2024-09-05 ENCOUNTER — Inpatient Hospital Stay: Admitting: Hematology and Oncology

## 2024-09-05 ENCOUNTER — Inpatient Hospital Stay: Attending: Hematology and Oncology

## 2024-09-05 VITALS — BP 129/79 | HR 74 | Temp 98.3°F | Resp 17 | Wt 268.3 lb

## 2024-09-05 DIAGNOSIS — Z825 Family history of asthma and other chronic lower respiratory diseases: Secondary | ICD-10-CM | POA: Diagnosis not present

## 2024-09-05 DIAGNOSIS — G839 Paralytic syndrome, unspecified: Secondary | ICD-10-CM | POA: Insufficient documentation

## 2024-09-05 DIAGNOSIS — Z7964 Long term (current) use of myelosuppressive agent: Secondary | ICD-10-CM | POA: Diagnosis not present

## 2024-09-05 DIAGNOSIS — R5383 Other fatigue: Secondary | ICD-10-CM | POA: Diagnosis not present

## 2024-09-05 DIAGNOSIS — K3184 Gastroparesis: Secondary | ICD-10-CM | POA: Insufficient documentation

## 2024-09-05 DIAGNOSIS — Z7982 Long term (current) use of aspirin: Secondary | ICD-10-CM | POA: Insufficient documentation

## 2024-09-05 DIAGNOSIS — F17290 Nicotine dependence, other tobacco product, uncomplicated: Secondary | ICD-10-CM | POA: Diagnosis not present

## 2024-09-05 DIAGNOSIS — F17210 Nicotine dependence, cigarettes, uncomplicated: Secondary | ICD-10-CM | POA: Insufficient documentation

## 2024-09-05 DIAGNOSIS — E669 Obesity, unspecified: Secondary | ICD-10-CM | POA: Diagnosis not present

## 2024-09-05 DIAGNOSIS — G4730 Sleep apnea, unspecified: Secondary | ICD-10-CM | POA: Diagnosis not present

## 2024-09-05 DIAGNOSIS — R634 Abnormal weight loss: Secondary | ICD-10-CM | POA: Diagnosis not present

## 2024-09-05 DIAGNOSIS — D45 Polycythemia vera: Secondary | ICD-10-CM | POA: Diagnosis present

## 2024-09-05 DIAGNOSIS — Z8249 Family history of ischemic heart disease and other diseases of the circulatory system: Secondary | ICD-10-CM | POA: Diagnosis not present

## 2024-09-05 DIAGNOSIS — Z83438 Family history of other disorder of lipoprotein metabolism and other lipidemia: Secondary | ICD-10-CM | POA: Diagnosis not present

## 2024-09-05 DIAGNOSIS — D751 Secondary polycythemia: Secondary | ICD-10-CM

## 2024-09-05 DIAGNOSIS — Z833 Family history of diabetes mellitus: Secondary | ICD-10-CM | POA: Insufficient documentation

## 2024-09-05 DIAGNOSIS — Z79899 Other long term (current) drug therapy: Secondary | ICD-10-CM | POA: Insufficient documentation

## 2024-09-05 LAB — CBC WITH DIFFERENTIAL/PLATELET
Abs Immature Granulocytes: 0.02 K/uL (ref 0.00–0.07)
Basophils Absolute: 0.1 K/uL (ref 0.0–0.1)
Basophils Relative: 1 %
Eosinophils Absolute: 0.3 K/uL (ref 0.0–0.5)
Eosinophils Relative: 3 %
HCT: 53.1 % — ABNORMAL HIGH (ref 39.0–52.0)
Hemoglobin: 18.2 g/dL — ABNORMAL HIGH (ref 13.0–17.0)
Immature Granulocytes: 0 %
Lymphocytes Relative: 24 %
Lymphs Abs: 1.7 K/uL (ref 0.7–4.0)
MCH: 30.8 pg (ref 26.0–34.0)
MCHC: 34.3 g/dL (ref 30.0–36.0)
MCV: 89.8 fL (ref 80.0–100.0)
Monocytes Absolute: 0.4 K/uL (ref 0.1–1.0)
Monocytes Relative: 5 %
Neutro Abs: 4.9 K/uL (ref 1.7–7.7)
Neutrophils Relative %: 67 %
Platelets: 263 K/uL (ref 150–400)
RBC: 5.91 MIL/uL — ABNORMAL HIGH (ref 4.22–5.81)
RDW: 15.6 % — ABNORMAL HIGH (ref 11.5–15.5)
WBC: 7.3 K/uL (ref 4.0–10.5)
nRBC: 0.4 % — ABNORMAL HIGH (ref 0.0–0.2)

## 2024-09-05 MED ORDER — HYDROXYUREA 500 MG PO CAPS: 500.0000 mg | ORAL_CAPSULE | Freq: Three times a day (TID) | ORAL | 1 refills | Status: DC

## 2024-09-05 NOTE — Progress Notes (Signed)
 [facility] CONSULT NOTE  Patient Care Team: [name], FNP as PCP - General (Family Medicine)  CHIEF COMPLAINTS/PURPOSE OF CONSULTATION:  Suspicion for PV  ASSESSMENT & PLAN:  Assessment and Plan Assessment & Plan  Polycythemia vera Suboptimal hematocrit control at 53, target is 45. Current treatment includes Hydrea  and low-dose aspirin .  No side effects reported. - Increase Hydrea  to two capsules in the morning and one in the afternoon. - Continue low-dose aspirin  daily. - Schedule tentative phlebotomy in four weeks if hematocrit remains elevated. - Monitor blood counts regularly.  Obesity Significant weight loss from 320 to 268 pounds with Zepbound.  - Continue Zepbound for weight management.  - Monitor for thrombosis symptoms: leg swelling, chest pain, shortness of breath. - Continue low-dose aspirin  daily.  HISTORY OF PRESENTING ILLNESS:  Mario Kim 43 y.o. 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  History of Present Illness   Mario Kim is a 43 year old male with polycythemia vera who presents for follow-up of his blood counts and medication management.  He is being followed for polycythemia vera and is currently on Hydrea  (hydroxyurea ) and baby aspirin . He has been taking a dose of 1000 mg daily, split as one capsule in the morning and one in the afternoon, for the past three weeks. His hematocrit has decreased from 61 to 53. He had a phlebotomy three weeks ago on the 12th  He has a history of fatigue, which he initially managed with testosterone therapy. However, since starting Hydrea , his fatigue has improved and he no longer requires testosterone. He has stopped taking testosterone and is not experiencing significant fatigue without it.  He is also taking Zepbound for weight loss related to sleep apnea. He  has lost approximately 52 pounds over the past four to five months, reducing his weight from 320 to 268 pounds. He notes that Zepbound causes him to eat significantly less due to 'stomach paralysis,' which slows gastric emptying.  No mouth sores or significant leg swelling, though he reports some swelling in his ankle from a previous accident in 2023.  All other systems were reviewed with the patient and are negative.  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    SURGICAL HISTORY: Past Surgical History:  Procedure Laterality Date   ENDOSCOPIC CONCHA BULLOSA RESECTION Right 08/06/2021   Procedure: ENDOSCOPIC CONCHA BULLOSA RESECTION;  Surgeon: Herminio Miu, MD;  Location: [facility];  Service: ENT;  Laterality: Right;   ETHMOIDECTOMY Bilateral 08/06/2021   Procedure: TOTAL ETHMOIDECTOMY AND LEFT FRONTAL;  Surgeon: Herminio Miu, MD;  Location: [facility];  Service: ENT;  Laterality: Bilateral;   IMAGE GUIDED SINUS SURGERY N/A 08/06/2021   Procedure: IMAGE GUIDED SINUS SURGERY;  Surgeon: Herminio Miu, MD;  Location: [facility];  Service: ENT;  Laterality: N/A;  need disk PLACED DISK ON OR CHARGE NURSE DESK 8-29-KP placed 2nd disk on OR charge nurse desk 8-31  kp   INCISION AND DRAINAGE OF WOUND Right 05/17/2022   Procedure: IRRIGATION AND DEBRIDEMENT RIGHT ANKLE;  Surgeon: Elsa Lonni SAUNDERS, MD;  Location: [facility] OR;  Service: Orthopedics;  Laterality: Right;   LUMBAR DISC SURGERY     L4-5   MAXILLARY ANTROSTOMY Bilateral 08/06/2021   Procedure: MAXILLARY ANTROSTOMY WITH TISSUE REMOVAL;  Surgeon: Herminio Miu, MD;  Location:  [facility];  Service: ENT;  Laterality: Bilateral;   NASAL SEPTOPLASTY W/ TURBINOPLASTY Bilateral 08/06/2021   Procedure: NASAL SEPTOPLASTY WITH TURBINATE REDUCTION;  Surgeon: Herminio Miu, MD;  Location: [facility];  Service: ENT;  Laterality: Bilateral;   ORIF ANKLE  FRACTURE Right 05/17/2022   Procedure: OPEN REDUCTION INTERNAL FIXATION (ORIF) ANKLE FRACTURE;  Surgeon: Elsa Lonni SAUNDERS, MD;  Location: [facility] OR;  Service: Orthopedics;  Laterality: Right;   SPHENOIDECTOMY Right 08/06/2021   Procedure: SPHENOIDECTOMY WITH TISSUE REMOVAL;  Surgeon: Herminio Miu, MD;  Location: [facility];  Service: ENT;  Laterality: Right;   STRABISMUS SURGERY Bilateral 04/28/2017   Procedure: REPAIR STRABISMUS BILATERAL;  Surgeon: Neysa Fallow, MD;  Location: [facility];  Service: Ophthalmology;  Laterality: Bilateral;   STRABISMUS SURGERY Bilateral 07/14/2017   Procedure: BILATERAL STRABISMUS REPAIR;  Surgeon: Neysa Fallow, MD;  Location: [facility];  Service: Ophthalmology;  Laterality: Bilateral;    SOCIAL HISTORY: Social History   Socioeconomic History   Marital status: Married    Spouse name: Not on file   Number of children: Not on file   Years of education: Not on file   Highest education level: Not on file  Occupational History   Not on file  Tobacco Use   Smoking status: Every Day    Current packs/day: 0.00    Average packs/day: 1.5 packs/day for 20.0 years (30.0 ttl pk-yrs)    Types: Cigarettes, E-cigarettes    Start date: 10/2000    Last attempt to quit: 10/2020    Years since quitting: 3.9   Smokeless tobacco: Never  Vaping Use   Vaping status: Former   Quit date: 10/05/2020  Substance and Sexual Activity   Alcohol use: Yes    Comment: rarely   Drug use: No   Sexual activity: Yes    Birth control/protection: None  Other Topics Concern   Not on file  Social History Narrative   Not on file   Social Drivers of Health   Financial Resource Strain: Not on file  Food Insecurity: No Food Insecurity (07/01/2024)   Hunger Vital Sign    Worried About Running Out of Food in the Last Year: Never true    Ran Out of Food in the Last Year: Never true  Transportation Needs: No Transportation Needs (07/01/2024)    PRAPARE - Administrator, Civil Service (Medical): No    Lack of Transportation (Non-Medical): No  Physical Activity: Not on file  Stress: Not on file  Social Connections: Unknown (07/01/2024)   Social Connection and Isolation Panel    Frequency of Communication with Friends and Family: Three times a week    Frequency of Social Gatherings with Friends and Family: Twice a week    Attends Religious Services: Not on Marketing executive or Organizations: Not on file    Attends Banker Meetings: Not on file    Marital Status: Not on file  Intimate Partner Violence: Not At Risk (07/01/2024)   Humiliation, Afraid, Rape, and Kick questionnaire    Fear of Current or Ex-Partner: No    Emotionally Abused: No    Physically Abused: No    Sexually Abused: No    FAMILY HISTORY: Family History  Problem Relation Age of Onset   Asthma Mother    Diabetes Mother    Hyperlipidemia Brother    Hypertension Brother    Other Father  unknown medical history    ALLERGIES:  is allergic to tomato.  MEDICATIONS:  Current Outpatient Medications  Medication Sig Dispense Refill   fluticasone (FLONASE) 50 MCG/ACT nasal spray Place 2 sprays into both nostrils daily as needed for allergies.     hydroxyurea  (HYDREA ) 500 MG capsule Take 1 capsule (500 mg total) by mouth 2 (two) times daily. May take with food to minimize GI side effects. 60 capsule 1   losartan (COZAAR) 50 MG tablet Take 50 mg by mouth daily.     omeprazole (PRILOSEC) 20 MG capsule Take 20 mg by mouth daily.     Respiratory Therapy Supplies (CARETOUCH 2 CPAP HOSE HANGER) MISC CPAP     testosterone cypionate (DEPOTESTOTERONE CYPIONATE) 100 MG/ML injection Inject 100 mg into the muscle once a week.     ZEPBOUND 7.5 MG/0.5ML Pen Inject 7.5 mg into the skin once a week.     No current facility-administered medications for this visit.     PHYSICAL EXAMINATION: ECOG PERFORMANCE STATUS: 0 -  Asymptomatic  Vitals:   09/05/24 1245  BP: 129/79  Pulse: 74  Resp: 17  Temp: 98.3 F (36.8 C)  SpO2: 98%   Filed Weights   09/05/24 1245  Weight: 268 lb 4.8 oz (121.7 kg)    GENERAL:alert, no distress and comfortable CTA bilaterally RRR Right ankle swelling at baseline.   LABORATORY DATA:  I have reviewed the data as listed Lab Results  Component Value Date   WBC 7.3 09/05/2024   HGB 18.2 (H) 09/05/2024   HCT 53.1 (H) 09/05/2024   MCV 89.8 09/05/2024   PLT 263 09/05/2024     Chemistry      Component Value Date/Time   NA 140 07/01/2024 1144   K 3.8 07/01/2024 1144   CL 103 07/01/2024 1144   CO2 29 07/01/2024 1144   BUN 11 07/01/2024 1144   CREATININE 1.26 (H) 07/01/2024 1144      Component Value Date/Time   CALCIUM 9.6 07/01/2024 1144   ALKPHOS 42 07/01/2024 1144   AST 32 07/01/2024 1144   ALT 38 07/01/2024 1144   BILITOT 0.8 07/01/2024 1144       RADIOGRAPHIC STUDIES: I have personally reviewed the radiological images as listed and agreed with the findings in the report. No results found.  All questions were answered. The patient knows to call the clinic with any problems, questions or concerns. I spent 30 minutes in the care of this patient including H and P, review of records, counseling and coordination of care.     Amber Stalls, MD 09/05/2024 12:56 PM

## 2024-09-05 NOTE — Progress Notes (Signed)
 [facility] CONSULT NOTE  Patient Care Team: [name], FNP as PCP - General (Family Medicine)  CHIEF COMPLAINTS/PURPOSE OF CONSULTATION:  Suspicion for PV  ASSESSMENT & PLAN:  Assessment and Plan Assessment & Plan  Polycythemia vera Managed with Hydrea  and aspirin .   HISTORY OF PRESENTING ILLNESS:  Mario Kim 43 y.o. 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  History of Present Illness   Mario Kim is a 43 year old male with polycythemia vera who presents for follow-up and medication adjustment.    All other systems were reviewed with the patient and are negative.  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    SURGICAL HISTORY: Past Surgical History:  Procedure Laterality Date   ENDOSCOPIC CONCHA BULLOSA RESECTION Right 08/06/2021   Procedure: ENDOSCOPIC CONCHA BULLOSA RESECTION;  Surgeon: Herminio Miu, MD;  Location: [facility];  Service: ENT;  Laterality: Right;   ETHMOIDECTOMY Bilateral 08/06/2021   Procedure: TOTAL ETHMOIDECTOMY AND LEFT FRONTAL;  Surgeon: Herminio Miu, MD;  Location: [facility];  Service: ENT;  Laterality: Bilateral;   IMAGE GUIDED SINUS SURGERY N/A 08/06/2021   Procedure: IMAGE GUIDED SINUS SURGERY;  Surgeon: Herminio Miu, MD;  Location: [facility];  Service: ENT;  Laterality: N/A;  need disk PLACED DISK ON OR CHARGE NURSE DESK 8-29-KP placed 2nd disk on OR charge nurse desk 8-31  kp   INCISION AND DRAINAGE OF WOUND Right 05/17/2022   Procedure: IRRIGATION AND DEBRIDEMENT RIGHT ANKLE;  Surgeon: Elsa Lonni SAUNDERS, MD;  Location: [facility] OR;  Service: Orthopedics;  Laterality: Right;   LUMBAR DISC SURGERY     L4-5   MAXILLARY ANTROSTOMY Bilateral 08/06/2021   Procedure: MAXILLARY ANTROSTOMY WITH TISSUE REMOVAL;   Surgeon: Herminio Miu, MD;  Location: [facility];  Service: ENT;  Laterality: Bilateral;   NASAL SEPTOPLASTY W/ TURBINOPLASTY Bilateral 08/06/2021   Procedure: NASAL SEPTOPLASTY WITH TURBINATE REDUCTION;  Surgeon: Herminio Miu, MD;  Location: [facility];  Service: ENT;  Laterality: Bilateral;   ORIF ANKLE FRACTURE Right 05/17/2022   Procedure: OPEN REDUCTION INTERNAL FIXATION (ORIF) ANKLE FRACTURE;  Surgeon: Elsa Lonni SAUNDERS, MD;  Location: [facility] OR;  Service: Orthopedics;  Laterality: Right;   SPHENOIDECTOMY Right 08/06/2021   Procedure: SPHENOIDECTOMY WITH TISSUE REMOVAL;  Surgeon: Herminio Miu, MD;  Location: [facility];  Service: ENT;  Laterality: Right;   STRABISMUS SURGERY Bilateral 04/28/2017   Procedure: REPAIR STRABISMUS BILATERAL;  Surgeon: Neysa Fallow, MD;  Location: [facility];  Service: Ophthalmology;  Laterality: Bilateral;   STRABISMUS SURGERY Bilateral 07/14/2017   Procedure: BILATERAL STRABISMUS REPAIR;  Surgeon: Neysa Fallow, MD;  Location: [facility];  Service: Ophthalmology;  Laterality: Bilateral;    SOCIAL HISTORY: Social History   Socioeconomic History   Marital status: Married    Spouse name: Not on file   Number of children: Not on file   Years of education: Not on file   Highest education level: Not on file  Occupational History   Not on file  Tobacco Use   Smoking status: Every Day    Current packs/day: 0.00    Average packs/day: 1.5 packs/day for 20.0 years (30.0 ttl pk-yrs)    Types: Cigarettes, E-cigarettes    Start date: 10/2000    Last attempt to quit: 10/2020  Years since quitting: 3.9   Smokeless tobacco: Never  Vaping Use   Vaping status: Former   Quit date: 10/05/2020  Substance and Sexual Activity   Alcohol use: Yes    Comment: rarely   Drug use: No   Sexual activity: Yes    Birth control/protection: None  Other Topics Concern   Not on file  Social History Narrative    Not on file   Social Drivers of Health   Financial Resource Strain: Not on file  Food Insecurity: No Food Insecurity (07/01/2024)   Hunger Vital Sign    Worried About Running Out of Food in the Last Year: Never true    Ran Out of Food in the Last Year: Never true  Transportation Needs: No Transportation Needs (07/01/2024)   PRAPARE - Administrator, Civil Service (Medical): No    Lack of Transportation (Non-Medical): No  Physical Activity: Not on file  Stress: Not on file  Social Connections: Unknown (07/01/2024)   Social Connection and Isolation Panel    Frequency of Communication with Friends and Family: Three times a week    Frequency of Social Gatherings with Friends and Family: Twice a week    Attends Religious Services: Not on Marketing executive or Organizations: Not on file    Attends Banker Meetings: Not on file    Marital Status: Not on file  Intimate Partner Violence: Not At Risk (07/01/2024)   Humiliation, Afraid, Rape, and Kick questionnaire    Fear of Current or Ex-Partner: No    Emotionally Abused: No    Physically Abused: No    Sexually Abused: No    FAMILY HISTORY: Family History  Problem Relation Age of Onset   Asthma Mother    Diabetes Mother    Hyperlipidemia Brother    Hypertension Brother    Other Father        unknown medical history    ALLERGIES:  is allergic to tomato.  MEDICATIONS:  Current Outpatient Medications  Medication Sig Dispense Refill   fluticasone (FLONASE) 50 MCG/ACT nasal spray Place 2 sprays into both nostrils daily as needed for allergies.     hydroxyurea  (HYDREA ) 500 MG capsule Take 1 capsule (500 mg total) by mouth 2 (two) times daily. May take with food to minimize GI side effects. 60 capsule 1   losartan (COZAAR) 50 MG tablet Take 50 mg by mouth daily.     omeprazole (PRILOSEC) 20 MG capsule Take 20 mg by mouth daily.     Respiratory Therapy Supplies (CARETOUCH 2 CPAP HOSE HANGER) MISC CPAP      testosterone cypionate (DEPOTESTOTERONE CYPIONATE) 100 MG/ML injection Inject 100 mg into the muscle once a week.     ZEPBOUND 7.5 MG/0.5ML Pen Inject 7.5 mg into the skin once a week.     No current facility-administered medications for this visit.     PHYSICAL EXAMINATION: ECOG PERFORMANCE STATUS: 0 - Asymptomatic  Vitals:   09/05/24 1245  BP: 129/79  Pulse: 74  Resp: 17  Temp: 98.3 F (36.8 C)  SpO2: 98%   Filed Weights   09/05/24 1245  Weight: 268 lb 4.8 oz (121.7 kg)    GENERAL:alert, no distress and comfortable   LABORATORY DATA:  I have reviewed the data as listed Lab Results  Component Value Date   WBC 7.3 09/05/2024   HGB 18.2 (H) 09/05/2024   HCT 53.1 (H) 09/05/2024   MCV 89.8 09/05/2024  PLT 263 09/05/2024     Chemistry      Component Value Date/Time   NA 140 07/01/2024 1144   K 3.8 07/01/2024 1144   CL 103 07/01/2024 1144   CO2 29 07/01/2024 1144   BUN 11 07/01/2024 1144   CREATININE 1.26 (H) 07/01/2024 1144      Component Value Date/Time   CALCIUM 9.6 07/01/2024 1144   ALKPHOS 42 07/01/2024 1144   AST 32 07/01/2024 1144   ALT 38 07/01/2024 1144   BILITOT 0.8 07/01/2024 1144       RADIOGRAPHIC STUDIES: I have personally reviewed the radiological images as listed and agreed with the findings in the report. No results found.  All questions were answered. The patient knows to call the clinic with any problems, questions or concerns. I spent 20 minutes in the care of this patient including H and P, review of records, counseling and coordination of care.     Amber Stalls, MD 09/05/2024 12:57 PM

## 2024-10-03 ENCOUNTER — Inpatient Hospital Stay

## 2024-10-03 ENCOUNTER — Inpatient Hospital Stay: Admitting: Hematology and Oncology

## 2024-10-03 VITALS — BP 141/93 | HR 84 | Resp 17

## 2024-10-03 VITALS — BP 143/89 | HR 93 | Temp 98.1°F | Resp 17 | Wt 266.1 lb

## 2024-10-03 DIAGNOSIS — D45 Polycythemia vera: Secondary | ICD-10-CM

## 2024-10-03 DIAGNOSIS — D751 Secondary polycythemia: Secondary | ICD-10-CM

## 2024-10-03 LAB — CMP (CANCER CENTER ONLY)
ALT: 22 U/L (ref 0–44)
AST: 20 U/L (ref 15–41)
Albumin: 4.4 g/dL (ref 3.5–5.0)
Alkaline Phosphatase: 48 U/L (ref 38–126)
Anion gap: 9 (ref 5–15)
BUN: 14 mg/dL (ref 6–20)
CO2: 25 mmol/L (ref 22–32)
Calcium: 9 mg/dL (ref 8.9–10.3)
Chloride: 107 mmol/L (ref 98–111)
Creatinine: 0.98 mg/dL (ref 0.61–1.24)
GFR, Estimated: >60 mL/min (ref >60)
Glucose, Bld: 105 mg/dL — ABNORMAL HIGH (ref 70–99)
Potassium: 3.7 mmol/L (ref 3.5–5.1)
Sodium: 141 mmol/L (ref 135–145)
Total Bilirubin: 0.5 mg/dL (ref 0.0–1.2)
Total Protein: 6.7 g/dL (ref 6.5–8.1)

## 2024-10-03 LAB — CBC WITH DIFFERENTIAL/PLATELET
Abs Immature Granulocytes: 0.01 K/uL (ref 0.00–0.07)
Basophils Absolute: 0.1 K/uL (ref 0.0–0.1)
Basophils Relative: 1 %
Eosinophils Absolute: 0.3 K/uL (ref 0.0–0.5)
Eosinophils Relative: 3 %
HCT: 49.3 % (ref 39.0–52.0)
Hemoglobin: 17.4 g/dL — ABNORMAL HIGH (ref 13.0–17.0)
Immature Granulocytes: 0 %
Lymphocytes Relative: 41 %
Lymphs Abs: 3.2 K/uL (ref 0.7–4.0)
MCH: 31.9 pg (ref 26.0–34.0)
MCHC: 35.3 g/dL (ref 30.0–36.0)
MCV: 90.3 fL (ref 80.0–100.0)
Monocytes Absolute: 0.4 K/uL (ref 0.1–1.0)
Monocytes Relative: 5 %
Neutro Abs: 3.9 K/uL (ref 1.7–7.7)
Neutrophils Relative %: 50 %
Platelets: 215 K/uL (ref 150–400)
RBC: 5.46 MIL/uL (ref 4.22–5.81)
RDW: 18.1 % — ABNORMAL HIGH (ref 11.5–15.5)
WBC: 7.8 K/uL (ref 4.0–10.5)
nRBC: 0 % (ref 0.0–0.2)

## 2024-10-03 NOTE — Addendum Note (Signed)
 Addended by: Kenslei Hearty, NAGA VENKATA KALIPRAVEENA on: 10/03/2024 09:52 AM   Modules accepted: Orders

## 2024-10-03 NOTE — Progress Notes (Signed)
 [facility] CONSULT NOTE  Patient Care Team: [name], FNP as PCP - General (Family Medicine)  CHIEF COMPLAINTS/PURPOSE OF CONSULTATION:  Suspicion for PV  ASSESSMENT & PLAN:  Assessment and Plan Assessment & Plan   Polycythemia vera Hemoglobin at 17.4, hematocrit at 49.   Goal: hct less than 45. - Perform phlebotomy today to reduce hematocrit. - Continue Hydrea  3 times a day. - Continue baby aspirin . - Repeat labs in one month. - Consider increasing Hydrea  dose if hemoglobin not at target by next visit.  Fatigue Improved since stopping testosterone. Current illness with cold/flu symptoms, affecting work. - Encourage hydration and supportive management.   HISTORY OF PRESENTING ILLNESS:  Mario Kim 43 y.o. 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  He has been feeling unwell for the past week, with symptoms severe enough to keep him out of work since last Wednesday. He describes feeling 'terrible' and is considering visiting urgent care for a note to extend his time off work. He attributes his symptoms to a cold, which he experiences annually when the weather changes, but notes that this year has been worse than previous years. He has had a fever over the past week. No new B symptoms such as drenching night sweats or unexplained weight loss.  He is currently taking Hydrea  three times a day, baby aspirin , and a blood pressure medication. He reports no side effects from these medications and has not missed any doses. He stopped taking testosterone, and since then, his fatigue has improved.  His hemoglobin level is 17.4, and his hematocrit is 49. He last underwent phlebotomy  one to two months ago. He has an appointment for phlebotomy today at 9:30 AM.  His weight has decreased by two pounds, now at 266 pounds, and he continues to lose weight slowly.  All other systems were reviewed with the patient and are negative.  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    SURGICAL HISTORY: Past Surgical History:  Procedure Laterality Date   ENDOSCOPIC CONCHA BULLOSA RESECTION Right 08/06/2021   Procedure: ENDOSCOPIC CONCHA BULLOSA RESECTION;  Surgeon: Herminio Miu, MD;  Location: [facility];  Service: ENT;  Laterality: Right;   ETHMOIDECTOMY Bilateral 08/06/2021   Procedure: TOTAL ETHMOIDECTOMY AND LEFT FRONTAL;  Surgeon: Herminio Miu, MD;  Location: [facility];  Service: ENT;  Laterality: Bilateral;   IMAGE GUIDED SINUS SURGERY N/A 08/06/2021   Procedure: IMAGE GUIDED SINUS SURGERY;  Surgeon: Herminio Miu, MD;  Location: [facility];  Service: ENT;  Laterality: N/A;  need disk PLACED DISK ON OR CHARGE NURSE DESK 8-29-KP placed 2nd disk on OR charge nurse desk 8-31  kp   INCISION AND DRAINAGE OF WOUND Right 05/17/2022   Procedure: IRRIGATION AND DEBRIDEMENT RIGHT ANKLE;  Surgeon: Elsa Lonni SAUNDERS, MD;  Location: [facility] OR;  Service: Orthopedics;  Laterality: Right;   LUMBAR DISC SURGERY     L4-5   MAXILLARY ANTROSTOMY Bilateral 08/06/2021   Procedure: MAXILLARY ANTROSTOMY WITH TISSUE REMOVAL;  Surgeon: Herminio Miu, MD;  Location: [facility];  Service: ENT;  Laterality: Bilateral;   NASAL SEPTOPLASTY W/ TURBINOPLASTY Bilateral 08/06/2021  Procedure: NASAL SEPTOPLASTY WITH TURBINATE REDUCTION;  Surgeon: Herminio Miu, MD;  Location: [facility];  Service: ENT;  Laterality: Bilateral;   ORIF ANKLE FRACTURE Right 05/17/2022   Procedure: OPEN REDUCTION INTERNAL FIXATION (ORIF) ANKLE FRACTURE;  Surgeon: Elsa Lonni SAUNDERS, MD;  Location: [facility] OR;  Service: Orthopedics;   Laterality: Right;   SPHENOIDECTOMY Right 08/06/2021   Procedure: SPHENOIDECTOMY WITH TISSUE REMOVAL;  Surgeon: Herminio Miu, MD;  Location: [facility];  Service: ENT;  Laterality: Right;   STRABISMUS SURGERY Bilateral 04/28/2017   Procedure: REPAIR STRABISMUS BILATERAL;  Surgeon: Neysa Fallow, MD;  Location: [facility];  Service: Ophthalmology;  Laterality: Bilateral;   STRABISMUS SURGERY Bilateral 07/14/2017   Procedure: BILATERAL STRABISMUS REPAIR;  Surgeon: Neysa Fallow, MD;  Location:  SURGERY CENTER;  Service: Ophthalmology;  Laterality: Bilateral;    SOCIAL HISTORY: Social History   Socioeconomic History   Marital status: Married    Spouse name: Not on file   Number of children: Not on file   Years of education: Not on file   Highest education level: Not on file  Occupational History   Not on file  Tobacco Use   Smoking status: Every Day    Current packs/day: 0.00    Average packs/day: 1.5 packs/day for 20.0 years (30.0 ttl pk-yrs)    Types: Cigarettes, E-cigarettes    Start date: 10/2000    Last attempt to quit: 10/2020    Years since quitting: 3.9   Smokeless tobacco: Never  Vaping Use   Vaping status: Former   Quit date: 10/05/2020  Substance and Sexual Activity   Alcohol use: Yes    Comment: rarely   Drug use: No   Sexual activity: Yes    Birth control/protection: None  Other Topics Concern   Not on file  Social History Narrative   Not on file   Social Drivers of Health   Financial Resource Strain: Not on file  Food Insecurity: No Food Insecurity (07/01/2024)   Hunger Vital Sign    Worried About Running Out of Food in the Last Year: Never true    Ran Out of Food in the Last Year: Never true  Transportation Needs: No Transportation Needs (07/01/2024)   PRAPARE - Administrator, Civil Service (Medical): No    Lack of Transportation (Non-Medical): No  Physical Activity: Not on file  Stress: Not on file   Social Connections: Unknown (07/01/2024)   Social Connection and Isolation Panel    Frequency of Communication with Friends and Family: Three times a week    Frequency of Social Gatherings with Friends and Family: Twice a week    Attends Religious Services: Not on Marketing Executive or Organizations: Not on file    Attends Banker Meetings: Not on file    Marital Status: Not on file  Intimate Partner Violence: Not At Risk (07/01/2024)   Humiliation, Afraid, Rape, and Kick questionnaire    Fear of Current or Ex-Partner: No    Emotionally Abused: No    Physically Abused: No    Sexually Abused: No    FAMILY HISTORY: Family History  Problem Relation Age of Onset   Asthma Mother    Diabetes Mother    Hyperlipidemia Brother    Hypertension Brother    Other Father        unknown medical history    ALLERGIES:  is allergic to tomato.  MEDICATIONS:  Current Outpatient Medications  Medication Sig Dispense Refill   fluticasone (FLONASE) 50 MCG/ACT nasal spray Place 2 sprays into both nostrils daily as needed for allergies.     hydroxyurea  (HYDREA ) 500 MG capsule Take 1 capsule (500 mg total) by mouth 3 (three) times daily. May take with food to minimize GI side effects. 90 capsule 1   losartan (COZAAR) 50 MG tablet Take 50 mg by mouth daily.     omeprazole (PRILOSEC) 20 MG capsule Take 20 mg by mouth daily.     Respiratory Therapy Supplies (CARETOUCH 2 CPAP HOSE HANGER) MISC CPAP     ZEPBOUND 7.5 MG/0.5ML Pen Inject 7.5 mg into the skin once a week.     No current facility-administered medications for this visit.     PHYSICAL EXAMINATION: ECOG PERFORMANCE STATUS: 0 - Asymptomatic  Vitals:   10/03/24 0845  BP: (!) 143/89  Pulse: 93  Resp: 17  Temp: 98.1 F (36.7 C)  SpO2: 96%   Filed Weights   10/03/24 0845  Weight: 266 lb 1.6 oz (120.7 kg)    GENERAL:alert, no distress and comfortable CTA bilaterally RRR Right ankle swelling at  baseline.   LABORATORY DATA:  I have reviewed the data as listed Lab Results  Component Value Date   WBC 7.8 10/03/2024   HGB 17.4 (H) 10/03/2024   HCT 49.3 10/03/2024   MCV 90.3 10/03/2024   PLT 215 10/03/2024     Chemistry      Component Value Date/Time   NA 141 10/03/2024 0811   K 3.7 10/03/2024 0811   CL 107 10/03/2024 0811   CO2 25 10/03/2024 0811   BUN 14 10/03/2024 0811   CREATININE 0.98 10/03/2024 0811      Component Value Date/Time   CALCIUM 9.0 10/03/2024 0811   ALKPHOS 48 10/03/2024 0811   AST 20 10/03/2024 0811   ALT 22 10/03/2024 0811   BILITOT 0.5 10/03/2024 0811       RADIOGRAPHIC STUDIES: I have personally reviewed the radiological images as listed and agreed with the findings in the report. No results found.  All questions were answered. The patient knows to call the clinic with any problems, questions or concerns. I spent 20 minutes in the care of this patient including H and P, review of records, counseling and coordination of care.     Amber Stalls, MD 10/03/2024 8:56 AM

## 2024-10-03 NOTE — Patient Instructions (Signed)

## 2024-10-03 NOTE — Progress Notes (Signed)
 Mario Kim presents today for phlebotomy per MD orders. Katrinka Converse, RN started phlebotomy procedure at [phone] and ended at [phone] when needle clotted off. 16 G phlebotomy kit used in [facility].  301 grams removed.  Patient wanted to try again on the other arm, Rivka Cedar, RN started phlebotomy procedure at [phone] and ended at 54. 60 G phlebotomy kit used in [facility].  200 grams removed for 501 grams total.   Patient accepted something to drink but declined 30 minute observation after procedure as well as any food. Vital signs stable at discharge and ambulatory to lobby.  Patient tolerated procedure well. IV needle removed intact.

## 2024-11-01 ENCOUNTER — Inpatient Hospital Stay

## 2024-11-01 ENCOUNTER — Inpatient Hospital Stay: Admitting: Hematology and Oncology

## 2024-11-04 ENCOUNTER — Inpatient Hospital Stay

## 2024-11-04 ENCOUNTER — Encounter: Payer: Self-pay | Admitting: Adult Health

## 2024-11-04 ENCOUNTER — Inpatient Hospital Stay: Attending: Hematology and Oncology | Admitting: Adult Health

## 2024-11-04 VITALS — BP 127/78 | HR 92 | Temp 98.3°F | Resp 16 | Ht 75.0 in | Wt 266.0 lb

## 2024-11-04 DIAGNOSIS — Z7982 Long term (current) use of aspirin: Secondary | ICD-10-CM | POA: Diagnosis not present

## 2024-11-04 DIAGNOSIS — Z8249 Family history of ischemic heart disease and other diseases of the circulatory system: Secondary | ICD-10-CM | POA: Insufficient documentation

## 2024-11-04 DIAGNOSIS — D45 Polycythemia vera: Secondary | ICD-10-CM | POA: Diagnosis not present

## 2024-11-04 DIAGNOSIS — Z833 Family history of diabetes mellitus: Secondary | ICD-10-CM | POA: Diagnosis not present

## 2024-11-04 DIAGNOSIS — F17290 Nicotine dependence, other tobacco product, uncomplicated: Secondary | ICD-10-CM | POA: Diagnosis not present

## 2024-11-04 DIAGNOSIS — F17210 Nicotine dependence, cigarettes, uncomplicated: Secondary | ICD-10-CM | POA: Diagnosis not present

## 2024-11-04 DIAGNOSIS — Z83438 Family history of other disorder of lipoprotein metabolism and other lipidemia: Secondary | ICD-10-CM | POA: Diagnosis not present

## 2024-11-04 DIAGNOSIS — Z825 Family history of asthma and other chronic lower respiratory diseases: Secondary | ICD-10-CM | POA: Insufficient documentation

## 2024-11-04 DIAGNOSIS — Z79899 Other long term (current) drug therapy: Secondary | ICD-10-CM | POA: Diagnosis not present

## 2024-11-04 DIAGNOSIS — D751 Secondary polycythemia: Secondary | ICD-10-CM

## 2024-11-04 LAB — CMP (CANCER CENTER ONLY)
ALT: 22 U/L (ref 0–44)
AST: 22 U/L (ref 15–41)
Albumin: 4.6 g/dL (ref 3.5–5.0)
Alkaline Phosphatase: 57 U/L (ref 38–126)
Anion gap: 10 (ref 5–15)
BUN: 11 mg/dL (ref 6–20)
CO2: 26 mmol/L (ref 22–32)
Calcium: 9.7 mg/dL (ref 8.9–10.3)
Chloride: 106 mmol/L (ref 98–111)
Creatinine: 1.12 mg/dL (ref 0.61–1.24)
GFR, Estimated: >60 mL/min (ref >60)
Glucose, Bld: 96 mg/dL (ref 70–99)
Potassium: 4.6 mmol/L (ref 3.5–5.1)
Sodium: 142 mmol/L (ref 135–145)
Total Bilirubin: 0.5 mg/dL (ref 0.0–1.2)
Total Protein: 7 g/dL (ref 6.5–8.1)

## 2024-11-04 LAB — CBC WITH DIFFERENTIAL/PLATELET
Abs Immature Granulocytes: 0.02 K/uL (ref 0.00–0.07)
Basophils Absolute: 0.1 K/uL (ref 0.0–0.1)
Basophils Relative: 1 %
Eosinophils Absolute: 0.2 K/uL (ref 0.0–0.5)
Eosinophils Relative: 3 %
HCT: 48.4 % (ref 39.0–52.0)
Hemoglobin: 16.9 g/dL (ref 13.0–17.0)
Immature Granulocytes: 0 %
Lymphocytes Relative: 35 %
Lymphs Abs: 2.4 K/uL (ref 0.7–4.0)
MCH: 34.7 pg — ABNORMAL HIGH (ref 26.0–34.0)
MCHC: 34.9 g/dL (ref 30.0–36.0)
MCV: 99.4 fL (ref 80.0–100.0)
Monocytes Absolute: 0.3 K/uL (ref 0.1–1.0)
Monocytes Relative: 4 %
Neutro Abs: 3.9 K/uL (ref 1.7–7.7)
Neutrophils Relative %: 57 %
Platelets: 199 K/uL (ref 150–400)
RBC: 4.87 MIL/uL (ref 4.22–5.81)
RDW: 18.6 % — ABNORMAL HIGH (ref 11.5–15.5)
WBC: 6.8 K/uL (ref 4.0–10.5)
nRBC: 0 % (ref 0.0–0.2)

## 2024-11-04 MED ORDER — HYDROXYUREA 500 MG PO CAPS: 1000.0000 mg | ORAL_CAPSULE | Freq: Two times a day (BID) | ORAL | 2 refills | Status: AC

## 2024-11-04 NOTE — Progress Notes (Signed)
 [facility] Cancer Follow up:    [name], FNP [phone] [address] [place] [address]   DIAGNOSIS: polycythemia   SUMMARY OF HEMATOLOGIC HISTORY: Polycythemia vera-JAK 2 positive in 06/2024  Goal: hct less than 45.  Hydrea  began in 07/2024 at 500mg  daily  Increased to 500mg  PO BID in 08/2024  Increased to 500mg  PO TID in 09/2024  Increased to 1000 mg PO BID 11/2024 - Perform phlebotomy PRN (declines due to billing issues) - Continue baby aspirin . - Consider increasing Hydrea  dose if hemoglobin not at target by next visit.  CURRENT THERAPY: Hydrea  and phlebotomy PRN  INTERVAL HISTORY:  Discussed the use of AI scribe software for clinical note transcription with the patient, who gave verbal consent to proceed.  History of Present Illness Mario Kim is a 43 year old male with polycythemia vera who presents for follow-up of his condition.  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  He currently takes Hydrea  500 mg three times daily and baby aspirin . He denies headaches, fatigue, skin issues, or diarrhea. His most recent hemoglobin was 16.9 and hematocrit 48, with a prior hemoglobin up to 20.  He last had therapeutic phlebotomy about two months ago. He reports billing concerns for phlebotomy with charges he believes were duplicated and is considering alternative sites for phlebotomy if it becomes necessary for his treatment.     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    SURGICAL HISTORY: Past Surgical History:  Procedure Laterality Date   ENDOSCOPIC CONCHA BULLOSA RESECTION Right 08/06/2021   Procedure: ENDOSCOPIC CONCHA BULLOSA RESECTION;   Surgeon: Herminio Miu, MD;  Location: [facility];  Service: ENT;  Laterality: Right;   ETHMOIDECTOMY Bilateral 08/06/2021   Procedure: TOTAL ETHMOIDECTOMY AND LEFT FRONTAL;  Surgeon: Herminio Miu, MD;  Location: [facility];  Service: ENT;  Laterality: Bilateral;   IMAGE GUIDED SINUS SURGERY N/A 08/06/2021   Procedure: IMAGE GUIDED SINUS SURGERY;  Surgeon: Herminio Miu, MD;  Location: [facility];  Service: ENT;  Laterality: N/A;  need disk PLACED DISK ON OR CHARGE NURSE DESK 8-29-KP placed 2nd disk on OR charge nurse desk 8-31  kp   INCISION AND DRAINAGE OF WOUND Right 05/17/2022   Procedure: IRRIGATION AND DEBRIDEMENT RIGHT ANKLE;  Surgeon: Elsa Lonni SAUNDERS, MD;  Location: [facility] OR;  Service: Orthopedics;  Laterality: Right;   LUMBAR DISC SURGERY     L4-5   MAXILLARY ANTROSTOMY Bilateral 08/06/2021   Procedure: MAXILLARY ANTROSTOMY WITH TISSUE REMOVAL;  Surgeon: Herminio Miu, MD;  Location: [facility];  Service: ENT;  Laterality: Bilateral;   NASAL SEPTOPLASTY W/ TURBINOPLASTY Bilateral 08/06/2021   Procedure: NASAL SEPTOPLASTY WITH TURBINATE REDUCTION;  Surgeon: Herminio Miu, MD;  Location: [facility];  Service: ENT;  Laterality: Bilateral;   ORIF ANKLE FRACTURE Right 05/17/2022   Procedure: OPEN REDUCTION INTERNAL FIXATION (ORIF) ANKLE FRACTURE;  Surgeon: Elsa Lonni SAUNDERS, MD;  Location: [facility] OR;  Service: Orthopedics;  Laterality: Right;   SPHENOIDECTOMY Right 08/06/2021   Procedure: SPHENOIDECTOMY WITH TISSUE REMOVAL;  Surgeon: Herminio Miu, MD;  Location: [facility];  Service: ENT;  Laterality: Right;   STRABISMUS SURGERY Bilateral 04/28/2017   Procedure: REPAIR STRABISMUS  BILATERAL;  Surgeon: Neysa Fallow, MD;  Location: [facility];  Service: Ophthalmology;  Laterality: Bilateral;   STRABISMUS SURGERY Bilateral 07/14/2017   Procedure: BILATERAL STRABISMUS REPAIR;  Surgeon: Neysa Fallow, MD;  Location:  [facility];  Service: Ophthalmology;  Laterality: Bilateral;    SOCIAL HISTORY: Social History   Socioeconomic History   Marital status: Married    Spouse name: Not on file   Number of children: Not on file   Years of education: Not on file   Highest education level: Not on file  Occupational History   Not on file  Tobacco Use   Smoking status: Every Day    Current packs/day: 0.00    Average packs/day: 1.5 packs/day for 20.0 years (30.0 ttl pk-yrs)    Types: Cigarettes, E-cigarettes    Start date: 10/2000    Last attempt to quit: 10/2020    Years since quitting: 4.0   Smokeless tobacco: Never  Vaping Use   Vaping status: Former   Quit date: 10/05/2020  Substance and Sexual Activity   Alcohol use: Yes    Comment: Socially   Drug use: No   Sexual activity: Yes    Birth control/protection: None  Other Topics Concern   Not on file  Social History Narrative   Not on file   Social Drivers of Health   Financial Resource Strain: Low Risk  (11/04/2024)   Overall Financial Resource Strain (CARDIA)    Difficulty of Paying Living Expenses: Not hard at all  Food Insecurity: No Food Insecurity (11/04/2024)   Hunger Vital Sign    Worried About Running Out of Food in the Last Year: Never true    Ran Out of Food in the Last Year: Never true  Transportation Needs: No Transportation Needs (11/04/2024)   PRAPARE - Administrator, Civil Service (Medical): No    Lack of Transportation (Non-Medical): No  Physical Activity: Sufficiently Active (11/04/2024)   Exercise Vital Sign    Days of Exercise per Week: 6 days    Minutes of Exercise per Session: 50 min  Stress: No Stress Concern Present (11/04/2024)   [employer] of Occupational Health - Occupational Stress Questionnaire    Feeling of Stress: Not at all  Social Connections: Moderately Isolated (11/04/2024)   Social Connection and Isolation Panel    Frequency of Communication with Friends and Family:  More than three times a week    Frequency of Social Gatherings with Friends and Family: Twice a week    Attends Religious Services: Never    Database Administrator or Organizations: No    Attends Banker Meetings: Never    Marital Status: Married  Catering Manager Violence: Not At Risk (11/04/2024)   Humiliation, Afraid, Rape, and Kick questionnaire    Fear of Current or Ex-Partner: No    Emotionally Abused: No    Physically Abused: No    Sexually Abused: No    FAMILY HISTORY: Family History  Problem Relation Age of Onset   Asthma Mother    Diabetes Mother    Hyperlipidemia Brother    Hypertension Brother    Other Father        unknown medical history    Review of Systems  Constitutional:  Negative for appetite change, chills, fatigue, fever and unexpected weight change.  HENT:   Negative for hearing loss, lump/mass and trouble swallowing.   Eyes:  Negative for eye problems and icterus.  Respiratory:  Negative for chest  tightness, cough and shortness of breath.   Cardiovascular:  Negative for chest pain, leg swelling and palpitations.  Gastrointestinal:  Negative for abdominal distention, abdominal pain, constipation, diarrhea, nausea and vomiting.  Endocrine: Negative for hot flashes.  Genitourinary:  Negative for difficulty urinating.   Musculoskeletal:  Negative for arthralgias.  Skin:  Negative for itching and rash.  Neurological:  Negative for dizziness, extremity weakness, headaches and numbness.  Hematological:  Negative for adenopathy. Does not bruise/bleed easily.  Psychiatric/Behavioral:  Negative for depression. The patient is not nervous/anxious.       PHYSICAL EXAMINATION    There were no vitals filed for this visit.  Physical Exam Constitutional:      General: He is not in acute distress.    Appearance: Normal appearance. He is not toxic-appearing.  HENT:     Head: Normocephalic and atraumatic.  Eyes:     General: No scleral  icterus. Skin:    General: Skin is warm and dry.     Findings: No rash.  Neurological:     General: No focal deficit present.     Mental Status: He is alert and oriented to person, place, and time.  Psychiatric:        Mood and Affect: Mood normal.        Behavior: Behavior normal.     LABORATORY DATA:  CBC    Component Value Date/Time   WBC 6.8 11/04/2024 0941   RBC 4.87 11/04/2024 0941   HGB 16.9 11/04/2024 0941   HCT 48.4 11/04/2024 0941   PLT 199 11/04/2024 0941   MCV 99.4 11/04/2024 0941   MCH 34.7 (H) 11/04/2024 0941   MCHC 34.9 11/04/2024 0941   RDW 18.6 (H) 11/04/2024 0941   LYMPHSABS 2.4 11/04/2024 0941   MONOABS 0.3 11/04/2024 0941   EOSABS 0.2 11/04/2024 0941   BASOSABS 0.1 11/04/2024 0941    CMP     Component Value Date/Time   NA 142 11/04/2024 0941   K 4.6 11/04/2024 0941   CL 106 11/04/2024 0941   CO2 26 11/04/2024 0941   GLUCOSE 96 11/04/2024 0941   BUN 11 11/04/2024 0941   CREATININE 1.12 11/04/2024 0941   CALCIUM 9.7 11/04/2024 0941   PROT 7.0 11/04/2024 0941   ALBUMIN 4.6 11/04/2024 0941   AST 22 11/04/2024 0941   ALT 22 11/04/2024 0941   ALKPHOS 57 11/04/2024 0941   BILITOT 0.5 11/04/2024 0941   GFRNONAA >60 11/04/2024 0941     ASSESSMENT and THERAPY PLAN:    Assessment and Plan Assessment & Plan Polycythemia vera Confirmed by JAK2 mutation. Managed with Hydroxyurea . Hemoglobin 16.9, hematocrit 48, slightly above target. Tolerating Hydroxyurea  well. - Increased Hydroxyurea  to 1000 mg PO BID.   - Avoided phlebotomy due to increased Hydroxyurea  dose and billing issues. - Scheduled follow-up in four weeks with lab work.    All questions were answered. The patient knows to call the clinic with any problems, questions or concerns. We can certainly see the patient much sooner if necessary.  Total encounter time:20 minutes*in face-to-face visit time, chart review, lab review, care coordination, order entry, and documentation of the  encounter time.    Morna Kendall, NP 11/04/24 11:04 AM Medical Oncology and Hematology [facility] [address] [place], [place] [address] Tel. [phone]    Fax. [phone]  *Total Encounter Time as defined by the Centers for Medicare and Medicaid Services includes, in addition to the face-to-face time of a patient visit (documented in the note  above) non-face-to-face time: obtaining and reviewing outside history, ordering and reviewing medications, tests or procedures, care coordination (communications with other health care professionals or caregivers) and documentation in the medical record.

## 2024-11-04 NOTE — Progress Notes (Signed)
 Oncology Visit Note  Patient seen today by Morna Kendall, DNP.  Chief Complaint: Polycythemia Vera.  Treatment: No treatment provided today per Morna Kendall.  Labs Reviewed: Hemoglobin 16.9 g/dL, within normal range.  Oncology Visit Intake  Patient roomed for oncology visit.  Vital signs stable (VSS).  All intake questions reviewed and answered.  Patient is ready for provider evaluation.

## 2024-12-02 ENCOUNTER — Inpatient Hospital Stay

## 2024-12-02 ENCOUNTER — Inpatient Hospital Stay: Admitting: Hematology and Oncology

## 2024-12-02 VITALS — BP 118/80 | HR 94 | Temp 98.0°F | Resp 18 | Ht 75.0 in | Wt 259.3 lb

## 2024-12-02 DIAGNOSIS — D45 Polycythemia vera: Secondary | ICD-10-CM

## 2024-12-02 LAB — CMP (CANCER CENTER ONLY)
ALT: 27 U/L (ref 0–44)
AST: 31 U/L (ref 15–41)
Albumin: 4.6 g/dL (ref 3.5–5.0)
Alkaline Phosphatase: 51 U/L (ref 38–126)
Anion gap: 11 (ref 5–15)
BUN: 16 mg/dL (ref 6–20)
CO2: 24 mmol/L (ref 22–32)
Calcium: 9.5 mg/dL (ref 8.9–10.3)
Chloride: 108 mmol/L (ref 98–111)
Creatinine: 1.18 mg/dL (ref 0.61–1.24)
GFR, Estimated: >60 mL/min
Glucose, Bld: 115 mg/dL — ABNORMAL HIGH (ref 70–99)
Potassium: 4.3 mmol/L (ref 3.5–5.1)
Sodium: 143 mmol/L (ref 135–145)
Total Bilirubin: 0.6 mg/dL (ref 0.0–1.2)
Total Protein: 6.8 g/dL (ref 6.5–8.1)

## 2024-12-02 LAB — CBC WITH DIFFERENTIAL/PLATELET
Abs Immature Granulocytes: 0.02 K/uL (ref 0.00–0.07)
Basophils Absolute: 0.1 K/uL (ref 0.0–0.1)
Basophils Relative: 1 %
Eosinophils Absolute: 0.1 K/uL (ref 0.0–0.5)
Eosinophils Relative: 2 %
HCT: 44.6 % (ref 39.0–52.0)
Hemoglobin: 16.2 g/dL (ref 13.0–17.0)
Immature Granulocytes: 0 %
Lymphocytes Relative: 39 %
Lymphs Abs: 2.8 K/uL (ref 0.7–4.0)
MCH: 37.2 pg — ABNORMAL HIGH (ref 26.0–34.0)
MCHC: 36.3 g/dL — ABNORMAL HIGH (ref 30.0–36.0)
MCV: 102.5 fL — ABNORMAL HIGH (ref 80.0–100.0)
Monocytes Absolute: 0.3 K/uL (ref 0.1–1.0)
Monocytes Relative: 4 %
Neutro Abs: 3.9 K/uL (ref 1.7–7.7)
Neutrophils Relative %: 54 %
Platelets: 182 K/uL (ref 150–400)
RBC: 4.35 MIL/uL (ref 4.22–5.81)
RDW: 16.1 % — ABNORMAL HIGH (ref 11.5–15.5)
WBC: 7.2 K/uL (ref 4.0–10.5)
nRBC: 0 % (ref 0.0–0.2)

## 2024-12-02 NOTE — Progress Notes (Signed)
 [facility] Cancer Follow up:    Mario Prentice SAUNDERS, FNP [phone] [address] [place] [address]   DIAGNOSIS: polycythemia   SUMMARY OF HEMATOLOGIC HISTORY: Polycythemia vera-JAK 2 positive in 06/2024  Goal: hct less than 45.  Hydrea  began in 07/2024 at 500mg  daily  Increased to 500mg  PO BID in 08/2024  Increased to 500mg  PO TID in 09/2024  Increased to 1000 mg PO BID 11/2024 - Perform phlebotomy PRN (declines due to billing issues) - Continue baby aspirin . - Consider increasing Hydrea  dose if hemoglobin not at target by next visit.  CURRENT THERAPY: Hydrea  and phlebotomy PRN  INTERVAL HISTORY:  Discussed the use of AI scribe software for clinical note transcription with the patient, who gave verbal consent to proceed.  History of Present Illness  Discussed the use of AI scribe software for clinical note transcription with the patient, who gave verbal consent to proceed.  History of Present Illness Mario Kim is a 43 year old male with polycythemia vera who presents for routine hematology follow-up to assess disease control and treatment tolerance.  He remains on hydroxyurea , two capsules twice daily, and daily enteric-coated aspirin . Hemoglobin is 16.2 g/dL, previously as high as 19-20 g/dL, and hematocrit is 44. Platelet and white blood cell counts have been reported as normal in recent labs.  He denies fevers, drenching night sweats, or abnormal bleeding. Appetite is good. He reports mild, occasional constipation characterized by infrequent delays in bowel movements that resolve spontaneously. No significant gastrointestinal symptoms are present. He remains physically active, including recent motorcycle outings.  Rest of the pertinent 10 point ROS reviewed and neg.   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  MEDICAL HISTORY: Past Medical History:  Diagnosis Date    Exotropia of both eyes 07/2017    SURGICAL HISTORY: Past Surgical History:  Procedure Laterality Date   ENDOSCOPIC CONCHA BULLOSA RESECTION Right 08/06/2021   Procedure: ENDOSCOPIC CONCHA BULLOSA RESECTION;  Surgeon: Herminio Miu, MD;  Location: [facility];  Service: ENT;  Laterality: Right;   ETHMOIDECTOMY Bilateral 08/06/2021   Procedure: TOTAL ETHMOIDECTOMY AND LEFT FRONTAL;  Surgeon: Herminio Miu, MD;  Location: [facility];  Service: ENT;  Laterality: Bilateral;   IMAGE GUIDED SINUS SURGERY N/A 08/06/2021   Procedure: IMAGE GUIDED SINUS SURGERY;  Surgeon: Herminio Miu, MD;  Location: [facility];  Service: ENT;  Laterality: N/A;  need disk PLACED DISK ON OR CHARGE NURSE DESK 8-29-KP placed 2nd disk on OR charge nurse desk [phone]  kp   INCISION AND DRAINAGE OF WOUND Right 05/17/2022   Procedure: IRRIGATION AND DEBRIDEMENT RIGHT ANKLE;  Surgeon: Elsa Lonni SAUNDERS, MD;  Location: [facility] OR;  Service: Orthopedics;  Laterality: Right;   LUMBAR DISC SURGERY     L4-5   MAXILLARY ANTROSTOMY Bilateral 08/06/2021   Procedure: MAXILLARY ANTROSTOMY WITH TISSUE REMOVAL;  Surgeon: Herminio Miu, MD;  Location: [facility];  Service: ENT;  Laterality: Bilateral;   NASAL SEPTOPLASTY W/ TURBINOPLASTY Bilateral 08/06/2021   Procedure: NASAL SEPTOPLASTY WITH TURBINATE REDUCTION;  Surgeon: Herminio Miu, MD;  Location: [facility];  Service: ENT;  Laterality: Bilateral;   ORIF ANKLE FRACTURE Right 05/17/2022   Procedure: OPEN REDUCTION INTERNAL FIXATION (ORIF) ANKLE FRACTURE;  Surgeon: Elsa Lonni SAUNDERS, MD;  Location: [facility] OR;  Service: Orthopedics;  Laterality: Right;   SPHENOIDECTOMY Right 08/06/2021   Procedure: SPHENOIDECTOMY WITH TISSUE REMOVAL;  Surgeon: Herminio,  Chinita, MD;  Location: [facility];  Service: ENT;  Laterality: Right;   STRABISMUS SURGERY Bilateral 04/28/2017   Procedure: REPAIR STRABISMUS BILATERAL;  Surgeon: Neysa Fallow, MD;   Location: [facility];  Service: Ophthalmology;  Laterality: Bilateral;   STRABISMUS SURGERY Bilateral 07/14/2017   Procedure: BILATERAL STRABISMUS REPAIR;  Surgeon: Neysa Fallow, MD;  Location: [facility];  Service: Ophthalmology;  Laterality: Bilateral;    SOCIAL HISTORY: Social History   Socioeconomic History   Marital status: Married    Spouse name: Not on file   Number of children: Not on file   Years of education: Not on file   Highest education level: Not on file  Occupational History   Not on file  Tobacco Use   Smoking status: Every Day    Current packs/day: 0.00    Average packs/day: 1.5 packs/day for 20.0 years (30.0 ttl pk-yrs)    Types: Cigarettes, E-cigarettes    Start date: 10/2000    Last attempt to quit: 10/2020    Years since quitting: 4.1   Smokeless tobacco: Never  Vaping Use   Vaping status: Former   Quit date: 10/05/2020  Substance and Sexual Activity   Alcohol use: Yes    Comment: Socially   Drug use: No   Sexual activity: Yes    Birth control/protection: None  Other Topics Concern   Not on file  Social History Narrative   Not on file   Social Drivers of Health   Tobacco Use: High Risk (11/04/2024)   Patient History    Smoking Tobacco Use: Every Day    Smokeless Tobacco Use: Never    Passive Exposure: Not on file  Financial Resource Strain: Low Risk (11/04/2024)   Overall Financial Resource Strain (CARDIA)    Difficulty of Paying Living Expenses: Not hard at all  Food Insecurity: No Food Insecurity (11/04/2024)   Epic    Worried About Radiation Protection Practitioner of Food in the Last Year: Never true    Ran Out of Food in the Last Year: Never true  Transportation Needs: No Transportation Needs (11/04/2024)   Epic    Lack of Transportation (Medical): No    Lack of Transportation (Non-Medical): No  Physical Activity: Sufficiently Active (11/04/2024)   Exercise Vital Sign    Days of Exercise per Week: 6 days    Minutes of Exercise  per Session: 50 min  Stress: No Stress Concern Present (11/04/2024)   [employer] of Occupational Health - Occupational Stress Questionnaire    Feeling of Stress: Not at all  Social Connections: Moderately Isolated (11/04/2024)   Social Connection and Isolation Panel    Frequency of Communication with Friends and Family: More than three times a week    Frequency of Social Gatherings with Friends and Family: Twice a week    Attends Religious Services: Never    Database Administrator or Organizations: No    Attends Banker Meetings: Never    Marital Status: Married  Catering Manager Violence: Not At Risk (11/04/2024)   Epic    Fear of Current or Ex-Partner: No    Emotionally Abused: No    Physically Abused: No    Sexually Abused: No  Depression (PHQ2-9): Low Risk (11/04/2024)   Depression (PHQ2-9)    PHQ-2 Score: 0  Alcohol Screen: Low Risk (11/04/2024)   Alcohol Screen    Last Alcohol Screening Score (AUDIT): 0  Housing: Unknown (11/04/2024)   Epic    Unable to Pay  for Housing in the Last Year: No    Number of Times Moved in the Last Year: Not on file    Homeless in the Last Year: No  Utilities: Not At Risk (11/04/2024)   Epic    Threatened with loss of utilities: No  Health Literacy: Adequate Health Literacy (11/04/2024)   B1300 Health Literacy    Frequency of need for help with medical instructions: Never    FAMILY HISTORY: Family History  Problem Relation Age of Onset   Asthma Mother    Diabetes Mother    Hyperlipidemia Brother    Hypertension Brother    Other Father        unknown medical history    Review of Systems  Constitutional:  Negative for appetite change, chills, fatigue, fever and unexpected weight change.  HENT:   Negative for hearing loss, lump/mass and trouble swallowing.   Eyes:  Negative for eye problems and icterus.  Respiratory:  Negative for chest tightness, cough and shortness of breath.   Cardiovascular:  Negative for chest pain,  leg swelling and palpitations.  Gastrointestinal:  Negative for abdominal distention, abdominal pain, constipation, diarrhea, nausea and vomiting.  Endocrine: Negative for hot flashes.  Genitourinary:  Negative for difficulty urinating.   Musculoskeletal:  Negative for arthralgias.  Skin:  Negative for itching and rash.  Neurological:  Negative for dizziness, extremity weakness, headaches and numbness.  Hematological:  Negative for adenopathy. Does not bruise/bleed easily.  Psychiatric/Behavioral:  Negative for depression. The patient is not nervous/anxious.       PHYSICAL EXAMINATION    Vitals:   12/02/24 0832  BP: 118/80  Pulse: 94  Resp: 18  Temp: 98 F (36.7 C)  SpO2: 100%    Physical Exam Constitutional:      General: He is not in acute distress.    Appearance: Normal appearance. He is not toxic-appearing.  HENT:     Head: Normocephalic and atraumatic.  Eyes:     General: No scleral icterus. Skin:    General: Skin is warm and dry.     Findings: No rash.  Neurological:     General: No focal deficit present.     Mental Status: He is alert and oriented to person, place, and time.  Psychiatric:        Mood and Affect: Mood normal.        Behavior: Behavior normal.     LABORATORY DATA:  CBC    Component Value Date/Time   WBC 7.2 12/02/2024 0803   RBC 4.35 12/02/2024 0803   HGB 16.2 12/02/2024 0803   HCT 44.6 12/02/2024 0803   PLT 182 12/02/2024 0803   MCV 102.5 (H) 12/02/2024 0803   MCH 37.2 (H) 12/02/2024 0803   MCHC 36.3 (H) 12/02/2024 0803   RDW 16.1 (H) 12/02/2024 0803   LYMPHSABS 2.8 12/02/2024 0803   MONOABS 0.3 12/02/2024 0803   EOSABS 0.1 12/02/2024 0803   BASOSABS 0.1 12/02/2024 0803    CMP     Component Value Date/Time   NA 142 11/04/2024 0941   K 4.6 11/04/2024 0941   CL 106 11/04/2024 0941   CO2 26 11/04/2024 0941   GLUCOSE 96 11/04/2024 0941   BUN 11 11/04/2024 0941   CREATININE 1.12 11/04/2024 0941   CALCIUM 9.7 11/04/2024 0941    PROT 7.0 11/04/2024 0941   ALBUMIN 4.6 11/04/2024 0941   AST 22 11/04/2024 0941   ALT 22 11/04/2024 0941   ALKPHOS 57 11/04/2024 0941   BILITOT 0.5  11/04/2024 0941   GFRNONAA >60 11/04/2024 0941     ASSESSMENT and THERAPY PLAN:    Assessment and Plan Assessment & Plan  Polycythemia vera, JAK 2 mutated. Chronic myeloproliferative neoplasm well-controlled on hydroxyurea  and low-dose aspirin . Hemoglobin and hematocrit near target. Platelet and leukocyte counts normal.  - Reinforced adherence to hydroxyurea  500 mg, two capsules twice daily. - Reinforced daily use of enteric-coated low-dose aspirin . - Advised adequate hydration daily. - No phlebotomy indicated; hematocrit below 45 - Follow-up in three months for blood counts and clinical status reassessment. - Reviewed Hydrea  supply; plan to refill if needed.  All questions were answered. The patient knows to call the clinic with any problems, questions or concerns. We can certainly see the patient much sooner if necessary.  Total encounter time:20 minutes*in face-to-face visit time, chart review, lab review, care coordination, order entry, and documentation of the encounter time.  *Total Encounter Time as defined by the Centers for Medicare and Medicaid Services includes, in addition to the face-to-face time of a patient visit (documented in the note above) non-face-to-face time: obtaining and reviewing outside history, ordering and reviewing medications, tests or procedures, care coordination (communications with other health care professionals or caregivers) and documentation in the medical record.

## 2025-03-04 ENCOUNTER — Inpatient Hospital Stay: Attending: Hematology and Oncology | Admitting: Hematology and Oncology

## 2025-03-04 ENCOUNTER — Inpatient Hospital Stay
